# Patient Record
Sex: Male | Born: 1970 | Race: Asian | Hispanic: No | Marital: Married | Smoking: Never smoker
Health system: Southern US, Community
[De-identification: ages and names within clinical notes are randomized; demographics above are authoritative.]

## PROBLEM LIST (undated history)

## (undated) DIAGNOSIS — E079 Disorder of thyroid, unspecified: Secondary | ICD-10-CM

## (undated) DIAGNOSIS — I493 Ventricular premature depolarization: Secondary | ICD-10-CM

## (undated) DIAGNOSIS — M1990 Unspecified osteoarthritis, unspecified site: Secondary | ICD-10-CM

## (undated) DIAGNOSIS — M545 Low back pain, unspecified: Secondary | ICD-10-CM

## (undated) DIAGNOSIS — K219 Gastro-esophageal reflux disease without esophagitis: Secondary | ICD-10-CM

## (undated) DIAGNOSIS — N400 Enlarged prostate without lower urinary tract symptoms: Secondary | ICD-10-CM

## (undated) DIAGNOSIS — Z8619 Personal history of other infectious and parasitic diseases: Secondary | ICD-10-CM

## (undated) HISTORY — DX: Low back pain: M54.5

## (undated) HISTORY — DX: Unspecified osteoarthritis, unspecified site: M19.90

## (undated) HISTORY — DX: Disorder of thyroid, unspecified: E07.9

## (undated) HISTORY — DX: Low back pain, unspecified: M54.50

## (undated) HISTORY — DX: Ventricular premature depolarization: I49.3

## (undated) HISTORY — DX: Benign prostatic hyperplasia without lower urinary tract symptoms: N40.0

## (undated) HISTORY — DX: Personal history of other infectious and parasitic diseases: Z86.19

## (undated) HISTORY — DX: Gastro-esophageal reflux disease without esophagitis: K21.9

## (undated) HISTORY — PX: ROTATOR CUFF REPAIR: SHX139

---

## 2002-03-02 ENCOUNTER — Ambulatory Visit (HOSPITAL_COMMUNITY): Admission: RE | Admit: 2002-03-02 | Discharge: 2002-03-02 | Payer: Self-pay | Admitting: Endocrinology

## 2002-03-02 ENCOUNTER — Encounter: Payer: Self-pay | Admitting: Endocrinology

## 2002-03-30 ENCOUNTER — Ambulatory Visit (HOSPITAL_COMMUNITY): Admission: RE | Admit: 2002-03-30 | Discharge: 2002-03-30 | Payer: Self-pay | Admitting: Endocrinology

## 2002-03-30 ENCOUNTER — Encounter: Payer: Self-pay | Admitting: Endocrinology

## 2005-07-22 ENCOUNTER — Encounter: Admission: RE | Admit: 2005-07-22 | Discharge: 2005-07-22 | Payer: Self-pay | Admitting: Specialist

## 2006-10-01 HISTORY — PX: TRANSTHORACIC ECHOCARDIOGRAM: SHX275

## 2006-11-25 DIAGNOSIS — E039 Hypothyroidism, unspecified: Secondary | ICD-10-CM

## 2006-11-25 HISTORY — DX: Hypothyroidism, unspecified: E03.9

## 2007-09-08 ENCOUNTER — Encounter: Admission: RE | Admit: 2007-09-08 | Discharge: 2007-12-07 | Payer: Self-pay | Admitting: Orthopedic Surgery

## 2008-01-07 ENCOUNTER — Encounter
Admission: RE | Admit: 2008-01-07 | Discharge: 2008-04-06 | Payer: Self-pay | Admitting: Physical Medicine & Rehabilitation

## 2008-01-11 ENCOUNTER — Ambulatory Visit: Payer: Self-pay | Admitting: Physical Medicine & Rehabilitation

## 2008-02-01 ENCOUNTER — Ambulatory Visit: Payer: Self-pay | Admitting: Physical Medicine & Rehabilitation

## 2008-02-16 ENCOUNTER — Ambulatory Visit: Payer: Self-pay | Admitting: Physical Medicine & Rehabilitation

## 2008-03-29 ENCOUNTER — Ambulatory Visit: Payer: Self-pay | Admitting: Physical Medicine & Rehabilitation

## 2008-04-11 ENCOUNTER — Encounter
Admission: RE | Admit: 2008-04-11 | Discharge: 2008-06-20 | Payer: Self-pay | Admitting: Physical Medicine & Rehabilitation

## 2008-04-12 ENCOUNTER — Ambulatory Visit: Payer: Self-pay | Admitting: Physical Medicine & Rehabilitation

## 2008-04-18 ENCOUNTER — Encounter: Admission: RE | Admit: 2008-04-18 | Discharge: 2008-07-17 | Payer: Self-pay | Admitting: Anesthesiology

## 2008-04-19 ENCOUNTER — Ambulatory Visit: Payer: Self-pay | Admitting: Anesthesiology

## 2008-05-03 ENCOUNTER — Ambulatory Visit: Payer: Self-pay | Admitting: Anesthesiology

## 2008-05-17 ENCOUNTER — Ambulatory Visit: Payer: Self-pay | Admitting: Anesthesiology

## 2008-06-20 ENCOUNTER — Ambulatory Visit: Payer: Self-pay | Admitting: Physical Medicine & Rehabilitation

## 2008-06-28 ENCOUNTER — Ambulatory Visit: Payer: Self-pay | Admitting: Anesthesiology

## 2008-07-29 ENCOUNTER — Encounter
Admission: RE | Admit: 2008-07-29 | Discharge: 2008-10-27 | Payer: Self-pay | Admitting: Physical Medicine & Rehabilitation

## 2008-08-01 ENCOUNTER — Ambulatory Visit: Payer: Self-pay | Admitting: Physical Medicine & Rehabilitation

## 2008-08-08 ENCOUNTER — Encounter: Admission: RE | Admit: 2008-08-08 | Discharge: 2008-11-06 | Payer: Self-pay | Admitting: Anesthesiology

## 2008-08-09 ENCOUNTER — Ambulatory Visit: Payer: Self-pay | Admitting: Anesthesiology

## 2008-09-06 ENCOUNTER — Ambulatory Visit: Payer: Self-pay | Admitting: Anesthesiology

## 2008-10-04 ENCOUNTER — Ambulatory Visit: Payer: Self-pay | Admitting: Anesthesiology

## 2008-11-03 ENCOUNTER — Ambulatory Visit (HOSPITAL_BASED_OUTPATIENT_CLINIC_OR_DEPARTMENT_OTHER): Admission: RE | Admit: 2008-11-03 | Discharge: 2008-11-03 | Payer: Self-pay | Admitting: Family Medicine

## 2008-11-03 ENCOUNTER — Ambulatory Visit: Payer: Self-pay | Admitting: Radiology

## 2008-11-07 ENCOUNTER — Encounter
Admission: RE | Admit: 2008-11-07 | Discharge: 2009-01-30 | Payer: Self-pay | Admitting: Physical Medicine & Rehabilitation

## 2008-11-09 ENCOUNTER — Ambulatory Visit: Payer: Self-pay | Admitting: Physical Medicine & Rehabilitation

## 2009-01-30 ENCOUNTER — Ambulatory Visit: Payer: Self-pay | Admitting: Physical Medicine & Rehabilitation

## 2009-03-03 ENCOUNTER — Encounter: Admission: RE | Admit: 2009-03-03 | Discharge: 2009-03-07 | Payer: Self-pay | Admitting: Anesthesiology

## 2009-03-07 ENCOUNTER — Ambulatory Visit: Payer: Self-pay | Admitting: Anesthesiology

## 2009-05-10 ENCOUNTER — Encounter
Admission: RE | Admit: 2009-05-10 | Discharge: 2009-07-05 | Payer: Self-pay | Admitting: Physical Medicine & Rehabilitation

## 2009-05-12 ENCOUNTER — Ambulatory Visit: Payer: Self-pay | Admitting: Physical Medicine & Rehabilitation

## 2010-05-18 IMAGING — CR DG CHEST 2V
2 series · 2 of 2 positions shown · non-contrast
Comparison: 07/22/2005

CLINICAL DATA: Chronic cough for 3 weeks.  No chest pain.
Nonsmoker.

CHEST - 2 VIEW

[w chest pa]
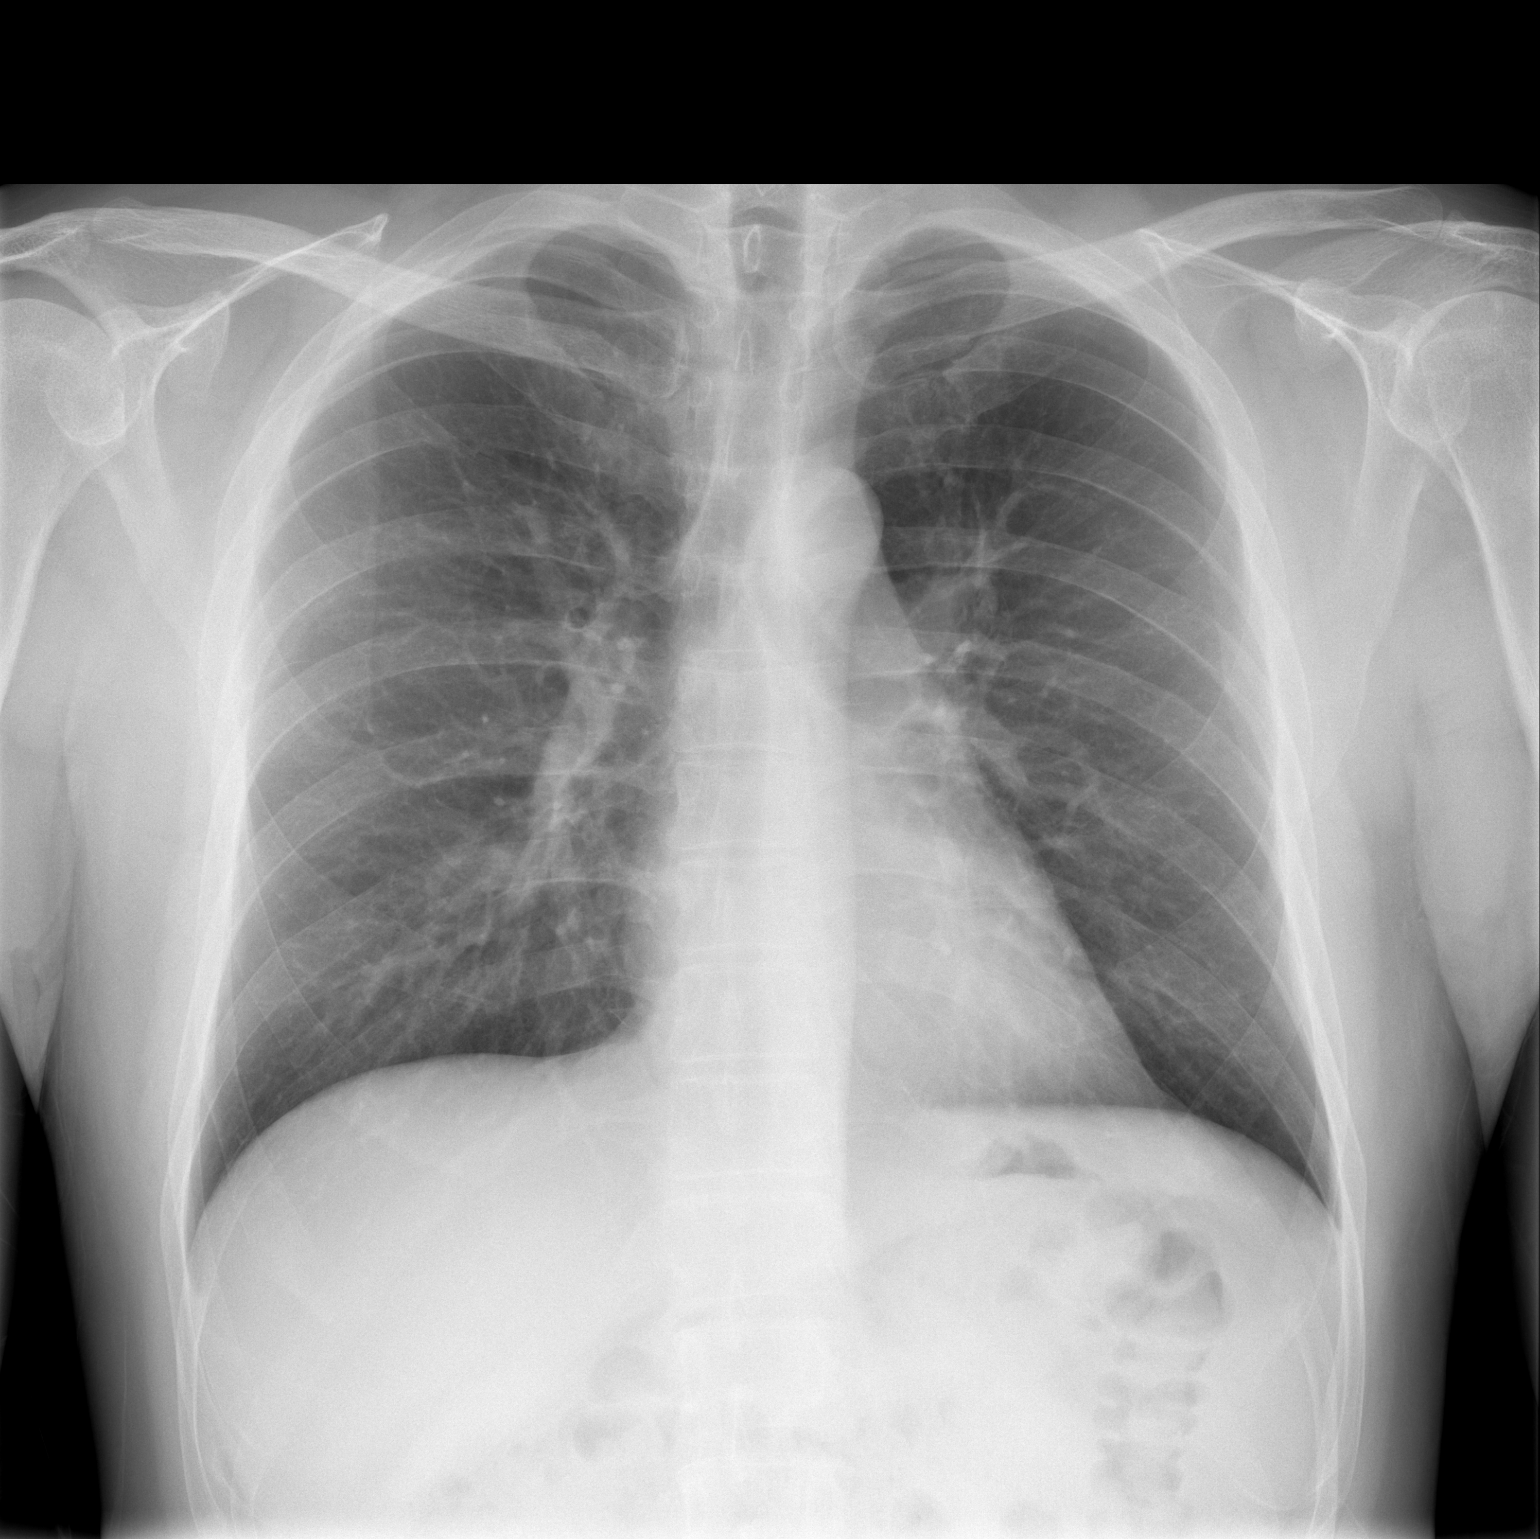

[w chest lat]
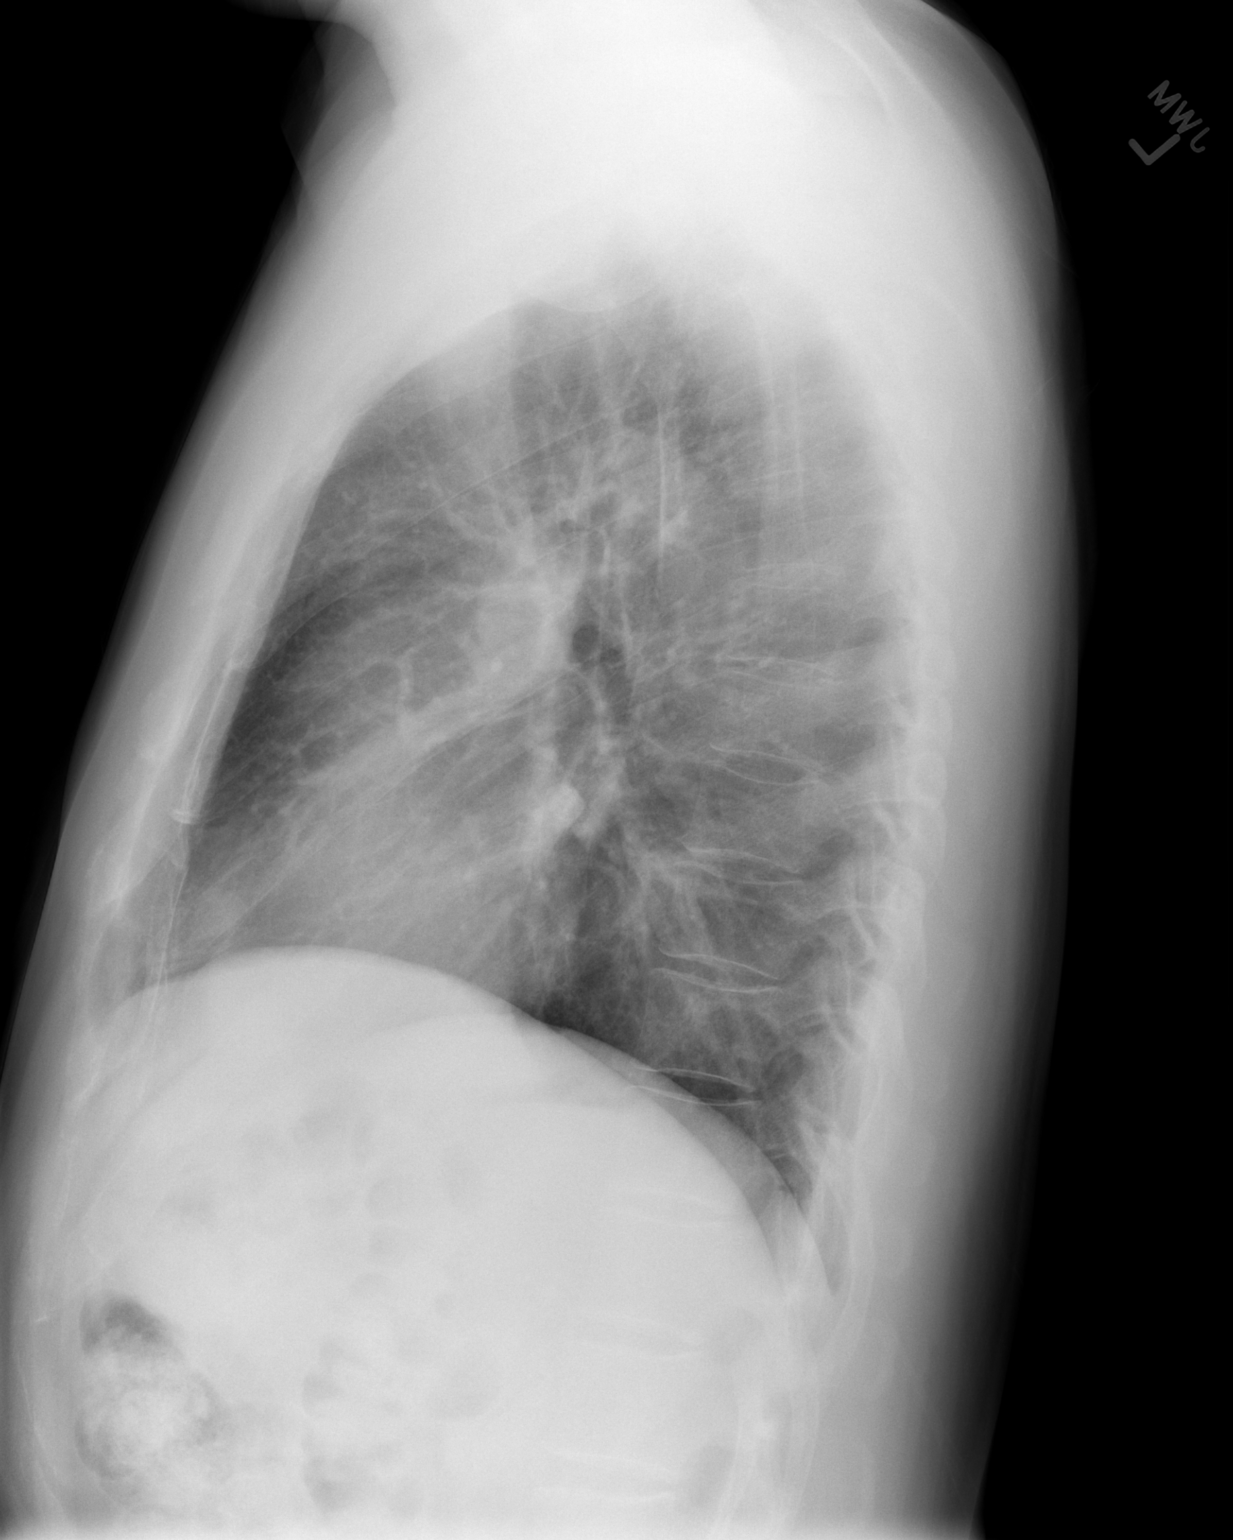

[2 of 2 positions shown; findings below may reference images not displayed]

FINDINGS: The cardiac and mediastinal silhouette appear
unremarkable.  The lungs are clear.  There is no bony abnormality.
There is no change from priors.
IMPRESSION: No active cardiopulmonary disease.

## 2010-11-27 NOTE — Procedures (Signed)
Steven Howell, Steven Howell               ACCOUNT NO.:  [id number]   MEDICAL RECORD NO.:  [id number]           PATIENT TYPE:   LOCATION:                                 FACILITY:   PHYSICIAN:  Ranelle Oyster, M.D.DATE OF BIRTH:  11-23-1970   DATE OF PROCEDURE:  DATE OF DISCHARGE:                               OPERATIVE REPORT   PROCEDURE:  Trigger point injections.   DIAGNOSTIC CODE:  723.9 myofascial trapezius/shoulder pain.   DESCRIPTION OF PROCEDURE:  After informed consent and preparation of  skin with isopropyl alcohol, we injected the right trapezius at 2  different locations, one anterior and one more posteriorly along the  medial third of the muscle were trigger points were palpated.  We also  injected the left trapezius in a medial position.  We injected the right  levator scapulae as well with one final injection.  Each injection  consisted of 2 mL of 1% lidocaine.  The patient tolerated well.  Aspiration technique was utilized prior to each injection.   I will see the patient back in 2-3 weeks' time.  Encouraged stretching  exercise, appropriate posture, etc.  Overall, is improved.      Ranelle Oyster, M.D.  Electronically Signed     ZTS/MEDQ  D:  02/01/2008 11:39:41  T:  02/02/2008 00:56:39  Job:  [id number]   cc:   Almedia Balls  Fax: [phone]

## 2010-11-27 NOTE — Procedures (Signed)
NAMEGOERGE, MOHR NO.:  [id number]   MEDICAL RECORD NO.:  [id number]         PATIENT TYPE:  HREC   LOCATION:                                 FACILITY:   PHYSICIAN:  Celene Kras, MD        DATE OF BIRTH:  1971/02/25   DATE OF PROCEDURE:  DATE OF DISCHARGE:                               OPERATIVE REPORT   1. [facility].  I      evaluated her, obtained history, and performed 14-point review of      systems.  He states he is about 50% improved from our initial      encounter, which he stated it was killing him, and the cervical      position is much less problematic.  He is still having problems in      the upper thoracic cervical region, descending a bit further down      into the region of about T3, with T2-T1 and C7 overlay.  It is      reasonable to inject these right and left side as we are making      progress, and consider positive predictive experience moving him      toward radiofrequency neural ablation.  I discussed this with him.  2. Other modifiable features and health profile discussed.  Another      rationale to perform intervention and minimize escalation of      controlled substances, he is appropriate here.   Objectively, diffuse parathoracic and paracervical myofascial comfort.  The spondylitic cervical position is improved.  Thoracic position is  still problematic particularly with extension and side bending.  Nothing  new neurological.   IMPRESSION:  Spondylosis cervical spine, spondylosis thoracic spine.   PLAN:  Cervical facet and thoracic facet medial branch intervention,  right and left side independent needle access points under local  anesthetic, 7, T1, 2, 3, with contributory innervation addressed at 1,  2.  Independent needle access points under local anesthetic.  Predicated  further intervention based on need and overall response.  He has  consented.  Follow up with Dr. Riley Kill, to  determine further course of  care.  We will give him a month's rest cycling.   The patient taken to the operative suite and placed in the prone  position.  Back prepped and draped in usual fashion.  Using a 25-gauge  needle, I advanced the cervical and thoracic facet as noted 7, 1, 2, and  3, right and left side independent needle access points.  I then  injected Marcaine 0.25%, MPF 0.5 mL at each level, with a total of 40 mg  Aristocort in divided dose.   Tolerated procedure well.  No complications from our procedure.  Assessment in the context of activities of daily living.  We will see  him for followup.           ______________________________  Celene Kras, MD     HH/MEDQ  D:  05/17/2008 16:08:11  T:  05/18/2008 05:18:19  Job:  [id number]

## 2010-11-27 NOTE — Procedures (Signed)
NAMEJOSEMARIA, BRINING NO.:  [id number]   MEDICAL RECORD NO.:  [id number]          PATIENT TYPE:  REC   LOCATION:  [facility]                          FACILITY:  [facility]   PHYSICIAN:  Celene Kras, MD        DATE OF BIRTH:  May 13, 1971   DATE OF PROCEDURE:  03/07/2009  DATE OF DISCHARGE:                               OPERATIVE REPORT   Steven Howell comes to the pain management today.  I evaluated him via  health and history form 14-point review of systems.  1. Spondylitic pain is escalated, he has had done really quite well,      functional improvement of both the cervical and lumbar procedures,      lumbar still benefiting.  It is reasonable to readdress the      cervical component.  He continues to work, he is very active.  He      does not like to take lot of medications, and he has demonstrated      clear predictive experience, with benchmarks greater than 75%,      better range of motion and quality of life indices.  I do not plan      to go on the block after this.  We consider even going onto RF at      some point.  I do not think that is necessary at this time as the      block seems to be helping him, but will revisit that if necessary.  2. Para lumbar position is still benefiting from intervention.  3. Follow up with Dr. Riley Kill.  Determine further course of care.   Objectively, diffuse paracervical myofascial, positive cervical fetal  compression test right and left with suprascapular, levator scapular  pain.  Pain with extension.  Nothing new neurologically.  Pain in the  paralumbar region mostly myofascial mechanical.   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   PLAN:  Facet medial branch intervention, above-mentioned levels, or at  mentioned levels 3, 4, 5, 6, and 7 right and left side, independent  needle access points under local anesthetic.  We to these levels as we  have a discussion as to what is  benefited him in the past and he thinks  that the higher segments were a significant benefit, so we will include  those, he understands potential for dizziness and dysphoria, and  reviewed with him.  He is consented for today's procedure.   The patient was taken to fluoroscopy suite and placed in supine  position.  Neck prepped and draped in the usual fashion using a 25-gauge  needle advanced facet at the medial branch of 3, 4, 5, 6, and 7 with  contributory innervation addressed right and left side, independent  needle access points under local anesthetic.  Confirmed placement.  Then, I inject 1-1/2 mL of lidocaine 1% MPF at each level with total of  40 mg of Aristocort divided dose.   Tolerated procedure well.  No complications from our procedures, assess  within context of activities of daily living.  We will see him in follow  up.  I will plan another block after this.  We will see how he does.          ______________________________  Celene Kras, MD    HH/MEDQ  D:  03/07/2009 12:03:30  T:  03/08/2009 05:44:13  Job:  [id number]

## 2010-11-27 NOTE — Assessment & Plan Note (Signed)
Steven Howell comes to [facility] today .  I evaluated  him and reviewed the Health and History form and 14-point review of  systems.   Steven Howell comes to [place] today stating recrudescence of the cervical  region, difficulty with endurance and range of motion activities.  He is  asking for a block today, I offered him trigger point injections, we  will give him a bit more time, and he is probably an individual who  would go to RF.  At this time, he wants to exhaust conservative  management and avoid RF and is requesting another block, so we will  probably delay that another month, and if he does need another block it  is probably reasonable as he gets the number of months of relief  cycling, and we minimize the escalation of controlled substances.  Furthermore, it allows him to help with his work, and play with his  young child, and he has many questions about overall expectations.  I  answered in lay terms.  He has had 2 thoracic, 2 cervical, the thoracic  is still responding, cervical is still problematic probably at 5, 6, 7  right and left, seemingly right greater than left at suprascapular and  levator scapular extension.  1. I do not believe further imaging or diagnostics are warranted at      this time considering our surgical colleagues at some point, but he      does not have any arm pain, mostly just spondylitic, and he      continues to respond to minimally invasive approaches.  He has      tried all the topicals and adjuncts.   Objectively, diffuse paracervical myofascial discomfort with positive  cervical facetal compression test, right and left, suboccipital  compression test positive.  Suprascapular and levator scapular pain.  I  do not see anything new neurologically.   IMPRESSION:  Spondylosis; degenerative spine disease, cervical spine;  degenerative spine disease of the thoracic spine.   PLAN:  Consider reinforcing the facet intervention at some  point and see  how he does.  Discharge instructions given.           ______________________________  Celene Kras, MD     HH/MedQ  D:  09/06/2008 11:25:06  T:  09/07/2008 02:37:10  Job #:  [id number]

## 2010-11-27 NOTE — Assessment & Plan Note (Signed)
Steven Howell is here and followup of his back pain.  He states that his pain  is much improved after the thoracic medial branch blocks, which were  essentially done at T3, T4, and T5 by Dr. Stevphen Rochester, bilaterally.  Complains of continued pain, however, to a lesser extent in the axillary  region as well as the subscapular region on the right and some other  vague areas over the shoulder blades, etc.  He states overall that his  pain is much improved, but then when he starts to become active, he has  problems with pain.  He rates his pain overall 4/10, described as a  burning and aching.  Pain interferes with general activity, relations  with others, enjoyment of life on a moderate level.  He occasionally  uses Lyrica and Celebrex, but does not uses every day.  He stopped  taking any of his antidepressants.  With most recently had him on  Lexapro.   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   SOCIAL HISTORY:  See above.  The patient lives with his wife and 1-year-  old son.   REVIEW OF SYSTEMS:  Notable for the above with no other specific changes  noted.   PHYSICAL EXAMINATION:  VITAL SIGNS:  Blood pressure 127/78, pulse is 81,  respiratory rate 18, and he is sating 97% on room air.  GENERAL:  The patient is pleasant, alert, and oriented x3.  On detailed examination of his back today.  There are no obvious  postural asymmetries.  There is some mild pain medial to the right  inferior border of the scapula, but this is not consistent.  It is a bit  worse with rightward bending, but minimally so.  He had full neck range  of motion of all shoulder and scapular movement today.  None of this  seemed to really provoke pain.  No pain with palpation of the axillary  region.  Flexion/extension of the spine causes no discomfort today nor  did rotation, lateral bending, etc.  Strength is 5/5 with normal sensory  exam.   ASSESSMENT:  Persistent thoracic and shoulder girdle pain,  which has  been multifactorial in nature.  The patient with spondylosis of the  cervical and thoracic spine, which has responded to trigger point  injections as well as recent medial branch blocks.  I think there is  also central aspect to his pain interwoven with some anxiety and  depression.  The patient was pushing for a thoracic epidural today.  It  is hard for me to see any frank indication for this based on his  radiographical imaging and exam.   PLAN:  1. I told the patient if he wishes to speak with Dr. Stevphen Rochester regarding      epidural that he could do so.  I can not specifically recommend an      epidural today.  2. We did start Cymbalta 30 mg p.o. q.a.m. to see if this helps up      from a central aspect with his pain.  I feel that the remainder of      his pain needs to be dealt with a more global aspect from mood,      coping, centralization standpoint.  His pain is substantially      improved from his initial visit to this office.  However, he seems      to focus on small areas of pain which I am not sure  ever truly will      disappear in his mind.  3. Along these lines I recommended followup physical therapy, to      reassess his posture as well as other modalities to improve the      efficiency and quality of his movements.  However, I am not sure      that he is interested in doing      anything of that nature at this point.  At times I am not sure      exactly what he is looking for from a treatment standpoint.      Steven Howell, M.D.  Electronically Signed     ZTS/MedQ  D:  08/01/2008 10:41:07  T:  08/02/2008 00:59:33  Job #:  [id number]   cc:   Celene Kras, MD  Fax: [phone]

## 2010-11-27 NOTE — Assessment & Plan Note (Signed)
Steven Howell is back regarding his back pain.  He has 40-60% decrease in his  scapular pain after the thoracic medial branch blocks, particularly at  C7, T1, T2, and T3.  He is still having some right axillary pain more  than left as well as some pain higher in the shoulder on the right.  He  rates his pain a 4/10.  Pain is dull and aching.  It seems to be worse  when he is active and better when he rests.  He sleeps well.  His mood  has been fairly good.   REVIEW OF SYSTEMS:  Notable for the above.  Full review is in the  written health and history section of the chart.   SOCIAL HISTORY:  The patient is married, living with his wife and son.  He is still working with [employer].   PHYSICAL EXAMINATION:  VITAL SIGNS:  Blood pressure 136/85, pulse is 82,  and respiratory rate 18.  He is sating 98% on room air.  GENERAL:  The patient is pleasant, alert, and oriented x3.  Affect is  bright and appropriate.  MUSCULOSKELETAL:  He still has some pain with particular maneuvering of  the thoracic spine, but really the pain was difficult to elicit.  He did  have no pain along the right sternocleidomastoid or trapezius muscles.  He has full rotator cuff movement.  He may have had some hypermobility  of the scapula in fact.  He had no tenderness in either bicipital  tendons nor the acromiclavicular joint.  Rotator cuff maneuvers were  negative.  NEUROLOGIC:  Motor exam was 5/5.  Normal sensory exam and reflexes  today.  PSYCH:  Mood was appropriate.  HEART:  Regular.  CHEST:  Clear.   ASSESSMENT:  Persistent thoracic spine pain.  Some of his pain appears  to have been facet mediated with good results with C7 through T3  injections.  We had a long discussion today regarding his axillary  shoulder pain.   PLAN:  1. We will send the patient back to Dr. Stevphen Rochester for T3, T4, and T5      medial branch blocks bilaterally to see if we can address any of      his lower shoulder blade and axillary  symptoms.  He can discuss      with Dr. Lisabeth Register regarding higher blocks in the further and potential      for radiofrequency ablation.  2. Continue exercise for range of motion as possible.  3. I will see him back, pending injections above.      Ranelle Oyster, M.D.  Electronically Signed     ZTS/MedQ  D:  06/20/2008 11:43:33  T:  06/21/2008 04:38:41  Job #:  [id number]   cc:   Celene Kras, MD  Fax: [phone]

## 2010-11-27 NOTE — Assessment & Plan Note (Signed)
Steven Howell is back regarding his leg and back pain.  He only had a few  days of relief with the last set of trigger point injections we  performed into the trapezius muscles.  He is having pain now in the  axillary line.  He is only having temporary relief from therapy.  He  liked the Flector patches somewhat and only experienced relief while he  had them on.  He is a bit frustrated with the whole process at this  point.  He rates his pain a 5/10 and describes it as dull, constant, and  aching.  Pain interferes with his general activity, relations with  others, and enjoyment of life on a moderate level.  Sleep is fair.   REVIEW OF SYSTEMS:  Notable for some depression, anxiety, and spasms.  Other pertinent positives are as above and full review is in the written  health and history section of the chart.   SOCIAL HISTORY:  The patient is married.  He is still working full time  as an Art gallery manager.   PHYSICAL EXAMINATION:  VITAL SIGNS:  Blood pressure 122/83, pulse 76,  respiratory rate 18, and sating 97% on room air.  GENERAL:  The patient is pleasant, alert and oriented x3.  Affect is  bright and appropriate.  Coordination is fair.  He had normal shoulder  movement and neck movement today.  He had some generalized vague  tenderness in the trapezius muscles bilaterally and into the rhomboids  and latissimus dorsi muscles.  Teres anterior muscles were not overtly  tender.  He did have noticeable elevation of the right hemipelvis over  the left at a quater inch.  He had really full unrestricted range of  motion that I could see.  I palpated no focal trigger points.  NEUROLOGICAL:  He had normal reflexes, sensations, and muscle strength.  Posture was generally appropriate except for elevation on the right.  HEART:  Regular.  CHEST:  Clear.  ABDOMEN:  Soft and nontender.   ASSESSMENT:  Ongoing myofascial shoulder girdle and thoracic pain.  He  has had some temporary relief to measures  including trigger point  injection therapy, etc.  He had been resistant to taking medications for  his pain.  Unfortunately, with the chronicity of his problem, he may  essentialize a bit with his pain symptoms.  I see no demonstrable  evidence of thoracic spine injury; therefore, I do not know that  injection is really a necessary option.   PLAN:  1. We will try to shift gears here a little bit.  He will resume      Lyrica 75 mg q.h.s. increasing to b.i.d.  2. Resume Celebrex 200 mg daily.  I want him to take both of these      medications regularly.  He can use heat, ice, and patches as well.  3. It is important that he continues with exercise and stretching.  I      think he can transition out of physical therapy and work on more of      a home exercise/maintenance program.  4. Consider antidepressant as well.  5. I will see him back in about 6 weeks' time.      Ranelle Oyster, M.D.  Electronically Signed     ZTS/MedQ  D:  02/16/2008 11:16:37  T:  02/17/2008 08:25:47  Job #:  [id number]

## 2010-11-27 NOTE — Assessment & Plan Note (Signed)
Steven Howell is back regarding his back pain.  He was unable to tolerate the  Cymbalta due to numbness in his arm.  He is back on his Lyrica 75 mg at  nighttime, which he finds benefits him somewhat.  He is having no nausea  with it at this time so far.  He wants to talk about potential facet  injections today in the T-spine areas.  Pain is still a 4/10.  Described  as constant.  Pain interferes with his general activity, in relationship  with others, and enjoyment of life on a moderate level.  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  Pain increases with walking,  standing, and general activity and improves with rest.  He is doing some  treatment of his own at home including massage, suction cup therapy,  etc.   REVIEW OF SYSTEMS:  Notable for depression and anxiety.  Other pertinent  positives are above.  Full review is in the written health and history  section in the chart.   SOCIAL HISTORY:  The patient is married, living with his wife and son,  and working as above.   PHYSICAL EXAMINATION:  VITAL SIGNS:  Blood pressure is 136/87, pulse is  93, respiratory rate 18, and he is sating 97% on room air.  GENERAL:  The patient is pleasant, alert and oriented x3.  He remains  anxious.  Coordination is good.  He has good posture today.  He has  minimal tenderness over the trapezius areas or rhomboids.  He had some  bruises over the area, where he used some suction cup treatments.  He is  able to bend either side without significant pain and rotate at the  trunk.  Neck flexion is intact as well as all other neck movements  today.  Strength is 5/5 with normal sensation and reflexes.  HEART:  Regular.  CHEST:  Clear.  ABDOMEN:  Soft and nontender.   ASSESSMENT:  1. 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      He could be displaying some facet arthropathy as well.  2. Anxiety and depression.   PLAN:  1. I still feel the need to treat his mood as the  biggest component of      his problem.  We will try Lexapro 10 mg nightly.  2. We will send the patient to Dr. Stevphen Rochester for bilateral medial branch      blocks at C7-T1, T1-T2, and T2-T3.  He has had no radiographical      evidence of facet arthropathy; however, with pain persistent, I      think we should go ahead with these to rule in/rule out the facets      as a cause of his pain.  3. He can continue with Lyrica for now as tolerated.  4. I will see him back pending the above.      Ranelle Oyster, M.D.  Electronically Signed     ZTS/MedQ  D:  04/12/2008 10:07:06  T:  04/13/2008 00:58:15  Job #:  [id number]

## 2010-11-27 NOTE — Procedures (Signed)
NAMEMARKES, SHATSWELL NO.:  [id number]   MEDICAL RECORD NO.:  [id number]          PATIENT TYPE:  REC   LOCATION:  [facility]                          FACILITY:  [facility]   PHYSICIAN:  Celene Kras, MD        DATE OF BIRTH:  03-11-1971   DATE OF PROCEDURE:  DATE OF DISCHARGE:                               OPERATIVE REPORT   Steven Howell comes to the pain management today.  I evaluated him via  the Health and History form 14-point review of systems, who responded to  cervical facet medial branch intervention.  It is reasonable to go on to  another block.  1. We plan 4, 5, 6, and 7 contributory innervation addressed,      independent needle access points right and left side, T1 will be      addressed.  2. Consider RF.  3. Remains very functional and active, these blocks have helped him,      but he understand that he is going to have a rest cycle after this      block, and we are mindful of steroid exposure related to him,      reviewed this procedure in detail, this is a therapeutic      intervention, as I do not think it is necessary go onto RF at this      time.  He is a responder, goes many months, and we can follow him      expectantly here.  Another rationale is to minimize escalation of      controlled substances.   Objectively his typical pain, suprascapular, levator scapular,  with  positive cervical facetal compression test.  Suboccipital compression  test positive.  Nothing new neurologically.   IMPRESSION:  Spondylosis, minimal myelopathy.   PLAN:  Cervical facet medial branch block 5, 6, and 7, 1.  Contributory  innervation addressed for independent needle access points under local  anesthetic.  He is consented and I did that with 0.25% Marcaine MPF 0.5  mL at each level with a total 40 of Aristocort in divided dose and  tolerated the procedure well.  No complications from our procedure.  Discharge instructions given.  Assessment within the context of  activities of daily living.           ______________________________  Celene Kras, MD     HH/MEDQ  D:  10/04/2008 12:01:32  T:  10/05/2008 01:14:01  Job:  [id number]

## 2010-11-27 NOTE — Procedures (Signed)
NAMEMarland Kitchen  Steven, Howell NO.:  [id number]   MEDICAL RECORD NO.:  [id number]          PATIENT TYPE:  REC   LOCATION:  [facility]                          FACILITY:  [facility]   PHYSICIAN:  Celene Kras, MD        DATE OF BIRTH:  Apr 30, 1971   DATE OF PROCEDURE:  DATE OF DISCHARGE:                               OPERATIVE REPORT   [facility].  I evaluated  him.  We obtained history and performed 14-point review of systems.  I  have reviewed Dr. Rosalyn Charters note's progress today and overall directed  care approach.  Apparently involved in a chiropractic manipulation  episode where the upper thoracic lower cervical segments were  manipulated resultant spondylitic pain.   I have reviewed imaging, which was essentially normal, but we will see  that sometimes with a facet disruption, and I am going to go ahead and  proceed with diagnostic therapeutic intervention.  I plan C7, T1, T2  right and left side independent needle access points under local  anesthetic.  He is also describing a cervical overlay and we may address  that as well.  Positive predictive experience lays  consideration of  radiofrequency neuroablation reviewed with him.  Questions were answered  and discussed in lay terms.  I have used models.  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   Objectively, mostly myofascial mechanical pain, pain with extension and  side bending, nothing new neurologically.   IMPRESSION:  Spondylosis, cervical, thoracic spine.  Degenerative spine  disease of the spine.   PLAN:  Facet medial branch intervention, C7, T1, T2 right and left side  independent needle access points under local anesthetic and he is  consented.  Predicated further intervention based on need and overall  response.  Consider addressing the cervical segments and followup in 2  weeks.  He will maintain contact with Dr. Riley Kill as well.   The  patient was taken to the fluoroscopy suite and placed in the prone  position.  The neck was prepped and draped in the usual fashion.  Using  a 25-gauge spinal needle under fluoroscopic observation, I advanced the  cervical facet at the medial branch C7, T1, T2 right and left side  independent needle access points.  Confirmed placement.  Then, injected  1 mL of lidocaine 1% MPF at each level with a total of 40 mg Aristocort  in divided dose.   He tolerated the procedure well.  No complications from our procedure.  Appropriate recovery.  Discharge instructions given.  Assessed within  context of activities of daily living.            ______________________________  Celene Kras, MD     HH/MEDQ  D:  04/19/2008 14:44:46  T:  04/20/2008 04:23:44  Job:  [id number]

## 2010-11-27 NOTE — Assessment & Plan Note (Signed)
PATIENT:  Steven Howell   DATE OF BIRTH:  04-20-1971.   SURGEON:  Jewel Baize. Stevphen Rochester, MD   [facility] and I  evaluated him.  I have reviewed the Health and History form.  I reviewed  the 14-point review of systems.   1. Dr. Tomasa Blase was asked to take a look at him.  He is benefited from      spondylitic intervention, to the thoracic facet lower cervical      elements, still having some persistent myofascial pain.  He is      improving, and therefore I recommend that he stay the course with      conservative management approach, and follow expectantly.  Consider      RF at some point.  I would only go on to RF if he has significant      either recrudescence or decline in function and quality of life.  2. I have used models and discussed in lay terms.  3. Home-based therapy.  Active and engaging, working with this young      family, and recommend conservative course of care at this time.   OBJECTIVE:  Mostly myofascial pain, diffuse parathoracic myofascial  discomfort and pain with extension and side bending.   IMPRESSION:  Spondylosis, degenerative spine disease of the thoracic  spine.   PROCEDURE:   PLAN:  Conservative management.  Discharge instructions given.           ______________________________  Celene Kras, MD     HH/MedQ  D:  08/09/2008 13:45:46  T:  08/10/2008 04:29:36  Job #:  [id number]

## 2010-11-27 NOTE — Assessment & Plan Note (Signed)
Steven Howell is back regarding his neck and back pain.  He had bilateral  medial branch blocks C4 through T1 by Dr. Stevphen Rochester and had good results  with that.  His pain is still in the vicinity, it was in the 4/10.  He  is interested in pursuing RFs, but like to consider more medial branch  blocks perhaps first.  He saw Dr. Stevphen Rochester in March of this year.  Pain is  dull, aching, usually in the low neck, and between his shoulder blades.  Pain interferes with general activity, in relations with others,  enjoyment of life on a moderate level.  Pain worsens with some  activities, improves with rest.  He uses Celebrex 200 mg daily p.r.n.   REVIEW OF SYSTEMS:  Notable for the above.  Full 14-point review is in  the written health and history section of the chart.   SOCIAL HISTORY:  Unchanged.   PHYSICAL EXAMINATION:  VITAL SIGNS:  Blood pressure is 123/79, pulse is  79, respiratory rate 18, he is sating 99% on room air.  GENERAL:  The patient is pleasant, alert, and oriented x3.  Affect is  generally bright and appropriate.  EXTREMITIES:  He has some generalized tenderness in the lower cervical  and upper thoracic spine and paraspinals.  There is some pain with  rotation of the scapulae bilaterally.  Rhomboids are slightly tender.  Strength is 5/5 and normal sensory exam.  NEUROLOGIC:  Cognitively, he is appropriate.   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  The patient with associated myofascial pain.  He has  responded to medial branch blocks.   PLAN:  1. Refer back to Dr. Stevphen Rochester for potential medial branch block C4      through C7 again.  Consider RR amps.  2. Wrote prescription for Celebrex 100 mg daily p.r.n. as he says he      would like to minimize medications if possible.  Celebrex seems to      work for him for flare-ups.  3. We will see him back after Dr. Stevphen Rochester on an as-needed basis.      Ranelle Oyster, M.D.  Electronically  Signed     ZTS/MedQ  D:  01/30/2009 09:47:13  T:  01/30/2009 23:12:20  Job #:  [id number]

## 2010-11-27 NOTE — Procedures (Signed)
NAMEDRAYCE, Steven Howell NO.:  [id number]   MEDICAL RECORD NO.:  [id number]           PATIENT TYPE:   LOCATION:                                 FACILITY:   PHYSICIAN:  Celene Kras, MD        DATE OF BIRTH:  11/08/1970   DATE OF PROCEDURE:  DATE OF DISCHARGE:                               OPERATIVE REPORT   Chr-Ming Keilman [facility].  I evaluated him  and reviewed the Health and History form and 14-point review of systems.   1. Cervical pain responded, as did upper thoracic to the injections,      spondylitic pain evident a bit lower.  His MRI is reviewed      essentially normal.  He does describe that as spondylitic pain,      with interference of activities of daily living and quality of life      indices.  Dr. Riley Kill has seen him and I think it is probably      reasonable we follow with T3, T4, and T5 thoracic intervention,      with contributory innervation addressed at 3.  This should cover      his described spondylitic pattern, and then we will give him rest      from interventions.  Consider RF at some point.  2. Other modifiable features in health profile discussed, forthright      engaging individual, he is very active, there is a newborn at home      and he wants to be able interact as best as possible, he is having      trouble here.   Objectively diffuse paralumbar myofascial discomfort, Fortin test  positive, side bending, pain with extension particularly in the thoracic  region.  Particularly directed to the right side.  Nothing new  neurologically.   IMPRESSION:  Spondylosis, minimal myelopathy, degenerative spine  disease, thoracic spine.   PLAN:  Thoracic intervention, facet medial branch, independent needle  access points under local anesthetic at 3, 4, and 5 right and left side,  and he is consented.   The patient was taken to the fluoroscopy suite and placed in the prone  position.  The back prepped and draped  in usual fashion using a 22-gauge  needle advanced into the thoracic facet at the medial branch, T3, T4,  and T5 right and left side independent needle access points, confirmed  placement.  I then injected 1 mL of Marcaine 0.5% MPF at each level with  a total of 40 mg of Aristocort in divided dose.   He tolerated the procedure well.  No complications from our procedure.  Appropriate recovery.  Discharge instructions given.  __________  activities of daily living and follow up with Dr. Riley Kill.           ______________________________  Celene Kras, MD     HH/MEDQ  D:  06/28/2008 16:39:31  T:  06/29/2008 05:02:23  Job:  [id number]

## 2010-11-27 NOTE — Assessment & Plan Note (Signed)
Steven Howell is back regarding his back pain.  He had the medial branch  blocks at C4, C5, C6, C7, T1 bilaterally by Dr. Stevphen Rochester.  He has had nice  effects with these.  His pain is 3/10.  He has had some increase in his  pain over the last week or so, but in general the pain has substantially  improved.  Denies pain with activities which require lifting and  prolonged exertion.  He is using occasional Celebrex for pain at this  point.  He discussed radiofrequency ablation with Dr. Stevphen Rochester as well as  the possibility.   Review of systems is notable for the above.  Full 14-point review is in  the written health and history section of the chart.   Social history is unchanged.   PHYSICAL EXAMINATION:  Blood pressure is 132/80, pulse is 71,  respiratory rate 16, sating 97% on room air.  The patient is pleasant,  alert and oriented x3.  Affect is bright and appropriate.  He is  generally stable.  He has some mild pain and tenderness along the  paraspinal muscles of the lower cervical and upper thoracic spine.  He  has some mild muscular pain in the lat as well as the rotator cuff  musculature, rhomboids, etc.  He has better neck range of motion in  general today.  His strength is 5/5.  Sensory exam is intact.  Cognitively, he is appropriate.   ASSESSMENT:  There was cervical higher thoracic spondylosis/facet  arthropathy.  The patient with associated myofascial pain.  He has had  good result with the recent medial branch blocks.   PLAN:  1. We continue  conservatively for now.  If pain recurs, could      consider repeat medial branch blocks versus RF.  2. We will use Celebrex 200 mg p.o. daily in the interim with an extra      one later in the day if needed.  3. Encouraged range of motion, strengthening, and endurance exercises      for trunk and shoulder girdle.  4. I will see him back in 3 months' time.      Ranelle Oyster, M.D.  Electronically Signed     ZTS/MedQ  D:   11/09/2008 10:54:03  T:  11/09/2008 23:36:33  Job #:  [id number]

## 2010-11-27 NOTE — Assessment & Plan Note (Signed)
Steven Howell is back regarding his neck and back pain.  He tried the Lyrica  and Celebrex but had to stop due to nausea.  He denies having  intermittent nausea and diarrhea still.  Apparently, he has had symptoms  of nausea and stomach upset in the past.  He is on Protonix daily which  he increased recently to b.i.d.  His pain is a bit improved 4/10,  although it is still present.  He has been a lot of stress at home due  to psychosocial issues.  He feels that he is anxious and depressed as a  whole and has some concern there.  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  He states that he has had ergonomic modifications to his work  site.   REVIEW OF SYSTEMS:  Notable for the above.  Full review is in the  written health and history section in the chart.   SOCIAL HISTORY:  The patient is married, living with his wife and son.  Mother is leaving soon.  She had been helping with the children.   PHYSICAL EXAMINATION:  VITAL SIGNS:  Blood pressure is 128/83, pulse is  69, respiratory rate 18, and he is sating 98% on room air.  GENERAL:  The patient is pleasant, alert and oriented x3.  Affect is  generally bright and appropriate, although he has been anxious.  Coordination is excellent.  He continues to have some tightness over the  right trapezius muscle more than left.  Posture is fair posteriorly,  although he is tended to stand with a head forward position.  No focal  or trigger points were appreciated today.  NEUROLOGICAL:  He had good strength and reflexes and sensory function.  HEART:  Regular.  CHEST:  Clear.  ABDOMEN:  Soft and nontender.   ASSESSMENT:  1. Chronic cervical thoracic pain related to myofascial dysfunction.  2. Anxiety with depression.   PLAN:  1. He will begin trial of Cymbalta 30 mg every morning for 1 week then      at 60 mg thereafter.  I think this will help with both pain and      mood.  2. He may use Celebrex p.r.n. only at this point. I hesitate using  it      scheduled due to his symptoms noted above.  3. Regarding his gastrointestinal symptoms, if these are persistent      despite treatment of his pain and anxiety should consider      gastrointestinal consult.  Continue Protonix for now.  4. We will not attempt to resume Lyrica at this point.  5. I encouraged appropriate posture, stretching exercise, etc.  6. I will see him back in about 2 months.      Ranelle Oyster, M.D.  Electronically Signed     ZTS/MedQ  D:  03/29/2008 12:05:49  T:  03/30/2008 03:26:03  Job #:  [id number]

## 2010-11-27 NOTE — Procedures (Signed)
NAMEALEXI, Howell NO.:  [id number]   MEDICAL RECORD NO.:  [id number]         PATIENT TYPE:  [facility]P   LOCATION:                                 FACILITY:   PHYSICIAN:  Celene Kras, MD        DATE OF BIRTH:  [birth year]   DATE OF PROCEDURE:  DATE OF DISCHARGE:                               OPERATIVE REPORT   Chr-Ming Sansoucie [facility].  I evaluated  him.  Reviewed history and performed 14-point review of systems.  He  does think some of his pain has improved.  I think we have had some  spondylitic response to the upper thoracic segments, describing more of  a cervical overlay today.  I am going to go ahead and address C6, C7, T1  today, right and left side with contributory elements at C5.  Risks,  complications, and options are fully outlined, bilateral intervention,  and he is well versed on this.  He has looked it up and is familiar with  my thought processes and possibly moving on to RF with positive  predictive experience.  He understands the benchmarks.  A very  intelligent individual, invested in his health decision making, I have  used models and discussed in lay terms.   Not sure we will go on to another intervention.  I want to see how he  does.  Risks, complications, and options are fully outlined.  Questions  were answered discussed in lay terms.   Objectively, diffuse parathoracic myofascial discomfort, paracervical  myofascial discomfort with positive cervical facetal compression test,  right and left, difficult to him, nothing new neurologically.   IMPRESSION:  Spondylosis, cervicothoracic.   PLAN:  Cervical facet medial branch intervention.  C5, C6, C7, T1 right  and left sides, independent needle access points under local anesthetic.  Predicate further intervention based on need and overall response with  questions answered, discussed in lay terms, and he is consented.   The patient was taken to fluoroscopy  suite and placed in prone position.  The back was prepped and draped in the usual fashion.  Using a 25-gauge  spinal needle, I advanced the facet at the medial branch of C5, C6, C7,  T1, right and left sides, independent needle access points confirmed  placement in multiple fluoroscopic positions.  Then, inject 0.5 mL of  lidocaine 1% MPF at each level with a total of 40 mg of Aristocort in  divided dose.   He tolerated the procedure well.  Assessment within context of  activities of daily living.  We will see him in 3-4 weeks.           ______________________________  Celene Kras, MD     HH/MEDQ  D:  05/03/2008 13:41:06  T:  05/04/2008 03:20:50  Job:  [id number]

## 2011-03-29 HISTORY — PX: OTHER SURGICAL HISTORY: SHX169

## 2011-08-16 ENCOUNTER — Institutional Professional Consult (permissible substitution): Payer: Self-pay | Admitting: Internal Medicine

## 2013-04-14 HISTORY — PX: NM MYOVIEW LTD: HXRAD82

## 2013-04-23 ENCOUNTER — Encounter: Payer: Self-pay | Admitting: Cardiology

## 2013-04-23 ENCOUNTER — Ambulatory Visit (INDEPENDENT_AMBULATORY_CARE_PROVIDER_SITE_OTHER): Payer: BC Managed Care – PPO | Admitting: Cardiology

## 2013-04-23 VITALS — BP 118/90 | HR 72 | Ht 69.0 in | Wt 186.6 lb

## 2013-04-23 DIAGNOSIS — R0789 Other chest pain: Secondary | ICD-10-CM | POA: Insufficient documentation

## 2013-04-23 DIAGNOSIS — I493 Ventricular premature depolarization: Secondary | ICD-10-CM

## 2013-04-23 DIAGNOSIS — R0602 Shortness of breath: Secondary | ICD-10-CM | POA: Insufficient documentation

## 2013-04-23 DIAGNOSIS — I4949 Other premature depolarization: Secondary | ICD-10-CM

## 2013-04-23 HISTORY — DX: Ventricular premature depolarization: I49.3

## 2013-04-23 MED ORDER — METOPROLOL SUCCINATE ER 25 MG PO TB24: 25.0000 mg | ORAL_TABLET | Freq: Every day | ORAL | Status: DC

## 2013-04-23 NOTE — Progress Notes (Signed)
PATIENTZalyn Howell MRN: [id number]  DOB: 11/25/1970   DOV:04/25/2013 PCP: Almedia Balls, MD  Clinic Note: Chief Complaint  Patient presents with  . Annual Exam    C/o occasional chest pressure, shortness of breath at rest, palpitations daily. This has been going on for about 5 months. States the pressure and SOB subside when he takes the Atenolol, he is not taking it every day.    HPI: Steven Howell is a 42 y.o. male with a PMH below who presents today for reevaluation of chest discomfort and strong heart beats. He is previously seen back in 2008 result 2004 for similar symptoms. At that time was evaluated with a treadmill stress test and a monitor which demonstrated frequent PVCs and bigeminy and trigeminy pattern. We did a nonstress test that was negative for any ischemia. He had an echocardiogram in 2008 was relatively normal..  Interval History: He presents today with about a month's worth of worsening discomfort in his chest. Can happen either at rest or with exertion usually after eating is a common time he notes it. He says he feels like a strange pressure in his chest with makes her short of breath. He also notes feeling very strong forceful heartbeats. He doesn't have any sense of palpitations as far as her rapid heartbeats. He does have irregular heartbeats. No sensation of dizziness or lightheadedness. No syncope or near-syncope. No PND, orthopnea or edema. He does notice that his symptoms are now much worse with exertion, he does note on his his taking his atenolol, his symptoms are much improved. His only concern with taking atenolol, as there moderate dramatically reduces his sexual drive.  He sees quite upset and concerned that these symptoms have occurred. He doesn't really like taking atenolol and having about needing to take medication for long-term. However he is willing to do so this would take symptoms improved. He does feel he was constipation but this is nothing serious. He  knows markers for the past and is going a more definitive assessment now is getting older. He's very concerned about the potential of having an adverse cardiac event occur and leaving his family without a provider.  Past Medical History  Diagnosis Date  . Thyroid disease   . Arthritis   . Lower back pain   . PVC (premature ventricular contraction)     W/ Holter monitor demonstrated trigeminy and bigeminy PVCs  . GERD (gastroesophageal reflux disease)     Prior Cardiac Evaluation and Past Surgical History: Past Surgical History  Procedure Laterality Date  . Exercise stress test  03/29/2011    Negative adequate Bruce protocol exercise stress test  . Transthoracic echocardiogram  10/01/2006    EF 55-65%, relatively normal   No Known Allergies  Current Outpatient Prescriptions  Medication Sig Dispense Refill  . finasteride (PROSCAR) 5 MG tablet Take 1 tablet by mouth every other day.      . metoprolol succinate (TOPROL XL) 25 MG 24 hr tablet Take 1 tablet (25 mg total) by mouth daily.  30 tablet  12  . SYNTHROID 100 MCG tablet Take 1 tablet by mouth daily.       No current facility-administered medications for this visit.    History   Social History Narrative   He is a married father of 2. He works for [employer].   He does not smoke or drink alcohol.    ROS: A comprehensive Review of Systems - Negative except Pertinent positives noted above. Psychological ROS: positive for -  anxiety, decreased libido and Would decrease the beta being while on atenolol  PHYSICAL EXAM BP 118/90  Pulse 72  Ht 5\' 9"  (1.753 m)  Wt 186 lb 9.6 oz (84.641 kg)  BMI 27.54 kg/m2 General appearance: alert, cooperative, appears stated age, no distress and not obese Neck: no adenopathy, no carotid bruit and no JVD Lungs: clear to auscultation bilaterally, normal percussion bilaterally and non-labored Heart: regular rate and rhythm, S1, S2 normal, no murmur, click, rub or gallop ; non-displaced PMI Abdomen:  soft, non-tender; bowel sounds normal; no masses,  no organomegaly; No HJR Extremities: extremities normal, atraumatic, no cyanosis or edema Pulses: 2+ and symmetric; Neurologic: Mental status: Alert, oriented, thought content appropriate Cranial nerves: normal (II-XII grossly intact) HEENT: [place]/AT, EOMI, MMM, anicteric sclera  [id number] today: Yes Rate:72 , Rhythm: NSR, normal ECG  Recent Labs: None  ASSESSMENT / PLAN: Chest tightness or pressure - occuring at rest Relatively healthy gentleman with recurrence of her rash and exertional chest pressure. These are usually associated with a strange heartbeats, but is also associated in some with dyspnea. He does note occasionally having with exertion as well. It is somewhat atypical and unusual as far as her cardiac status ischemic etiology, however he does have quite frequent PVCs that are relatively symptomatic and therefore the results of possibility of cardiac ischemia. He has had 2 negative treadmill stress test in the past, now for recurrent symptoms. At this point I think a more definitive study is warranted, he has much for the patient's comfort level as it is for the fact that treadmill stress tests are not as accurate.  Plan: Treadmill/exercise Myoview to better assess any his PVCs and there reaction to exertion as well as to evaluate for ischemia. Based on these results, we'll determine how aggressive we would need to be within the respective modification. I'll see him back after the tests are over immediately if the stress test is abnormal, otherwise and would wait at least 2 months to ensure that beta blockers be effective in treating his symptoms.  Shortness of breath Feeling dyspneic but would be unusual simply with his PVCs, therefore it to get worse a little more when evaluation as described above.  PVC's (premature ventricular contractions) The really seem to be the daughter is helpful with his symptoms, but he doesn't want  it because of decreased to below. I think it needs the atenolol is nonspecific and a beta blocker and he may benefit from a more specific beta-1 selective beta blocker.   Plan: DC atenolol, start Toprol-XL 25 mg daily. Nadolol would be the next best choice for long term coverage, but lacks Beta1 selectivity.    Orders Placed This Encounter  Procedures  . Myocardial Perfusion Imaging    Standing Status: Future     Number of Occurrences:      Standing Expiration Date: 04/23/2014    Order Specific Question:  Where should this test be performed    Answer:  [facility]    Order Specific Question:  Type of stress    Answer:  Exercise    Order Specific Question:  Patient weight in lbs    Answer:  186  . EKG 12-Lead  . EKG 12-Lead    This order was created through External Result Entry   Meds ordered this encounter  Medications  . finasteride (PROSCAR) 5 MG tablet    Sig: Take 1 tablet by mouth every other day.  . metoprolol succinate (TOPROL XL) 25 MG 24  hr tablet    Sig: Take 1 tablet (25 mg total) by mouth daily.    Dispense:  30 tablet    Refill:  12   Followup: 2 months  DAVID W. Herbie Baltimore, M.D., M.S. [facility] [address] [place], [place]  [address]  [phone] Pager # [phone]

## 2013-04-23 NOTE — Patient Instructions (Signed)
So it would seem that your Abnormal Heart Beats are coming back.    This time it seems that they are more symptomatic.  --> Since the Atenolol has affected other quality of life concerns, I am going to switch medications to Toprol (Metoprolol Succinate) 25 mg daily (if this medicine is not covered by your insurance, I will change to the shorter acting 2x daily version).  Because the symptoms seem more severe & you are reluctant to exert yourself, we will check an Exercise Nuclear Stress Test.  Marykay Lex, MD  Your physician wants you to follow-up in: 2 You will receive a reminder letter in the mail two months in advance. If you don't receive a letter, please call our office to schedule the follow-up appointment.

## 2013-04-25 ENCOUNTER — Encounter: Payer: Self-pay | Admitting: Cardiology

## 2013-04-25 NOTE — Assessment & Plan Note (Signed)
The really seem to be the daughter is helpful with his symptoms, but he doesn't want it because of decreased to below. I think it needs the atenolol is nonspecific and a beta blocker and he may benefit from a more specific beta-1 selective beta blocker.   Plan: DC atenolol, start Toprol-XL 25 mg daily. Nadolol would be the next best choice for long term coverage, but lacks Beta1 selectivity.

## 2013-04-25 NOTE — Assessment & Plan Note (Signed)
Feeling dyspneic but would be unusual simply with his PVCs, therefore it to get worse a little more when evaluation as described above.

## 2013-04-25 NOTE — Assessment & Plan Note (Addendum)
Relatively healthy gentleman with recurrence of her rash and exertional chest pressure. These are usually associated with a strange heartbeats, but is also associated in some with dyspnea. He does note occasionally having with exertion as well. It is somewhat atypical and unusual as far as her cardiac status ischemic etiology, however he does have quite frequent PVCs that are relatively symptomatic and therefore the results of possibility of cardiac ischemia. He has had 2 negative treadmill stress test in the past, now for recurrent symptoms. At this point I think a more definitive study is warranted, he has much for the patient's comfort level as it is for the fact that treadmill stress tests are not as accurate.  Plan: Treadmill/exercise Myoview to better assess any his PVCs and there reaction to exertion as well as to evaluate for ischemia. Based on these results, we'll determine how aggressive we would need to be within the respective modification. I'll see him back after the tests are over immediately if the stress test is abnormal, otherwise and would wait at least 2 months to ensure that beta blockers be effective in treating his symptoms.

## 2013-04-27 ENCOUNTER — Ambulatory Visit: Payer: Self-pay | Admitting: Cardiology

## 2013-04-29 ENCOUNTER — Encounter (HOSPITAL_COMMUNITY): Payer: BC Managed Care – PPO

## 2013-05-03 ENCOUNTER — Encounter: Payer: Self-pay | Admitting: Cardiology

## 2013-05-07 ENCOUNTER — Ambulatory Visit (HOSPITAL_COMMUNITY)
Admission: RE | Admit: 2013-05-07 | Discharge: 2013-05-07 | Disposition: A | Discharge status: Home / Self Care | Payer: BC Managed Care – PPO | Source: Ambulatory Visit | Attending: Cardiovascular Disease | Admitting: Cardiovascular Disease

## 2013-05-07 DIAGNOSIS — R42 Dizziness and giddiness: Secondary | ICD-10-CM | POA: Insufficient documentation

## 2013-05-07 DIAGNOSIS — E663 Overweight: Secondary | ICD-10-CM | POA: Insufficient documentation

## 2013-05-07 DIAGNOSIS — R0602 Shortness of breath: Secondary | ICD-10-CM | POA: Insufficient documentation

## 2013-05-07 DIAGNOSIS — R002 Palpitations: Secondary | ICD-10-CM | POA: Insufficient documentation

## 2013-05-07 DIAGNOSIS — I493 Ventricular premature depolarization: Secondary | ICD-10-CM

## 2013-05-07 DIAGNOSIS — R079 Chest pain, unspecified: Secondary | ICD-10-CM | POA: Insufficient documentation

## 2013-05-07 DIAGNOSIS — R0789 Other chest pain: Secondary | ICD-10-CM

## 2013-05-07 DIAGNOSIS — R0609 Other forms of dyspnea: Secondary | ICD-10-CM | POA: Insufficient documentation

## 2013-05-07 DIAGNOSIS — I4949 Other premature depolarization: Secondary | ICD-10-CM

## 2013-05-07 DIAGNOSIS — R0989 Other specified symptoms and signs involving the circulatory and respiratory systems: Secondary | ICD-10-CM | POA: Insufficient documentation

## 2013-05-07 MED ORDER — TECHNETIUM TC 99M SESTAMIBI GENERIC - CARDIOLITE
31.6000 | Freq: Once | INTRAVENOUS | Status: AC | PRN
  Administered 2013-05-07: 32 via INTRAVENOUS

## 2013-05-07 MED ORDER — TECHNETIUM TC 99M SESTAMIBI GENERIC - CARDIOLITE
10.2000 | Freq: Once | INTRAVENOUS | Status: AC | PRN
  Administered 2013-05-07: 10 via INTRAVENOUS

## 2013-05-07 NOTE — Procedures (Addendum)
[name] [address] [place] [address] [phone]  Cardiology Nuclear Med Study  Steven Howell is a 42 y.o. male     MRN : [id number]     DOB: Sep 28, 1970  Procedure Date: 05/07/2013  Nuclear Med Background Indication for Stress Test:  Evaluation for Ischemia History:  No history reported. Cardiac Risk Factors: Lipids, Overweight and PVC's  Symptoms:  Chest Pain, DOE, Light-Headedness, Palpitations and SOB   Nuclear Pre-Procedure Caffeine/Decaff Intake:  9:00pm NPO After: 7:00am   IV Site: R Hand  IV 0.9% NS with Angio Cath:  22g  Chest Size (in):  44"  IV Started by: Emmit Pomfret, RN  Height: 5\' 9"  (1.753 m)  Cup Size: n/a  BMI:  Body mass index is 27.45 kg/(m^2). Weight:  186 lb (84.369 kg)   Tech Comments:  n/a    Nuclear Med Study 1 or 2 day study: 1 day  Stress Test Type:  Stress  Order Authorizing Provider:  Bryan Lemma, MD   Resting Radionuclide: Technetium 54m Sestamibi  Resting Radionuclide Dose: 10.2 mCi   Stress Radionuclide:  Technetium 92m Sestamibi  Stress Radionuclide Dose: 31.6 mCi           Stress Protocol Rest HR: 74 Stress HR: 173  Rest BP:129/80 Stress BP: 196/94  Exercise Time (min): 10:31 METS: 11.90   Predicted Max HR: 178 bpm % Max HR: 97.19 bpm Rate Pressure Product: 21308  Dose of Adenosine (mg):  n/a Dose of Lexiscan: n/a mg  Dose of Atropine (mg): n/a Dose of Dobutamine: n/a mcg/kg/min (at max HR)  Stress Test Technologist: Ernestene Mention, CCT Nuclear Technologist: Gonzella Lex, CNMT   Rest Procedure:  Myocardial perfusion imaging was performed at rest 45 minutes following the intravenous administration of Technetium 41m Sestamibi. Stress Procedure:  The patient performed treadmill exercise using a Bruce  Protocol for 10 minutes and 31 seconds. The patient stopped due to leg discomfort and fatigue. Patient denied any chest pain.  There were no significant ST-T wave  changes.  Technetium 65m Sestamibi was injected at peak exercise and myocardial perfusion imaging was performed after a brief delay.  Transient Ischemic Dilatation (Normal <1.22):  0.70 Lung/Heart Ratio (Normal <0.45):  0.27 QGS EDV:  76 ml QGS ESV:  22 ml LV Ejection Fraction: 71%  Signed by       Rest ECG: NSR - Normal EKG  Stress ECG: No significant change from baseline ECG  QPS Raw Data Images:  Normal; no motion artifact; normal heart/lung ratio. Stress Images:  Normal homogeneous uptake in all areas of the myocardium. Rest Images:  Normal homogeneous uptake in all areas of the myocardium. Subtraction (SDS):  No evidence of ischemia.  Impression Exercise Capacity:  Excellent exercise capacity. BP Response:  Normal blood pressure response. Clinical Symptoms:  No significant symptoms noted. ECG Impression:  No significant ST segment change suggestive of ischemia. Comparison with Prior Nuclear Study: No previous nuclear study performed  Overall Impression:  Normal stress nuclear study.  LV Wall Motion:  NL LV Function; NL Wall Motion   Runell Gess, MD  05/07/2013 5:41 PM

## 2013-05-13 ENCOUNTER — Telehealth: Payer: Self-pay | Admitting: *Deleted

## 2013-05-13 NOTE — Telephone Encounter (Signed)
Left message of myoview results ,call if any question.

## 2013-05-13 NOTE — Telephone Encounter (Signed)
Message copied by Tobin Chad on Thu May 13, 2013  3:06 PM ------      Message from: Herbie Baltimore, DAVID W      Created: Fri May 07, 2013  6:45 PM       GREAT NEWS - negative ST.  CP is not related to heart artery disease.            Marykay Lex, MD       ------

## 2013-05-13 NOTE — Telephone Encounter (Signed)
Call to pt and results given per MD.  Pt verbalized understanding.  

## 2013-05-13 NOTE — Telephone Encounter (Signed)
Returning call from [facility].

## 2014-09-14 DIAGNOSIS — M542 Cervicalgia: Secondary | ICD-10-CM

## 2014-09-14 HISTORY — DX: Cervicalgia: M54.2

## 2015-08-21 DIAGNOSIS — N529 Male erectile dysfunction, unspecified: Secondary | ICD-10-CM | POA: Insufficient documentation

## 2015-08-21 DIAGNOSIS — G8929 Other chronic pain: Secondary | ICD-10-CM | POA: Insufficient documentation

## 2015-08-21 DIAGNOSIS — N400 Enlarged prostate without lower urinary tract symptoms: Secondary | ICD-10-CM | POA: Insufficient documentation

## 2015-08-21 HISTORY — DX: Male erectile dysfunction, unspecified: N52.9

## 2015-08-21 HISTORY — DX: Other chronic pain: G89.29

## 2015-11-03 DIAGNOSIS — F411 Generalized anxiety disorder: Secondary | ICD-10-CM

## 2015-11-03 HISTORY — DX: Generalized anxiety disorder: F41.1

## 2016-04-24 DIAGNOSIS — K30 Functional dyspepsia: Secondary | ICD-10-CM | POA: Insufficient documentation

## 2016-04-24 HISTORY — DX: Functional dyspepsia: K30

## 2016-05-30 ENCOUNTER — Ambulatory Visit (INDEPENDENT_AMBULATORY_CARE_PROVIDER_SITE_OTHER): Payer: BLUE CROSS/BLUE SHIELD | Admitting: Cardiology

## 2016-05-30 ENCOUNTER — Encounter: Payer: Self-pay | Admitting: Cardiology

## 2016-05-30 VITALS — BP 117/70 | HR 74 | Ht 69.0 in | Wt 170.0 lb

## 2016-05-30 DIAGNOSIS — I493 Ventricular premature depolarization: Secondary | ICD-10-CM | POA: Diagnosis not present

## 2016-05-30 DIAGNOSIS — R0789 Other chest pain: Secondary | ICD-10-CM | POA: Diagnosis not present

## 2016-05-30 MED ORDER — METOPROLOL SUCCINATE ER 25 MG PO TB24: 25.0000 mg | ORAL_TABLET | Freq: Every day | ORAL | 3 refills | Status: DC

## 2016-05-30 NOTE — Progress Notes (Signed)
PCP: Steven BallsKELLY,SAM, MD  Clinic Note: Chief Complaint  Patient presents with  . New Patient (Initial Visit)  . Palpitations    has been occurring randomly for 2 weeks  . Shortness of Breath    when palpitations occur    HPI: Steven Howell is a 45 y.o. male with a PMH below who presents today for reestablishing cardiology care.. I last saw Steven Howell in October 2014 as part of evaluation for chest discomfort strong heartbeats. He previously been evaluated with a treadmill stress test as well as monitor demonstrating PVCs with bigeminy trigeminy. I switched him from atenolol to metoprolol XL with the potential of considering Nadolol all is well. Echocardiogram in 2008 was normal Reevaluated with a Myoview stress test that month - read as normal, low risk studies with no ischemia or infarction and normal EF.  Recent Hospitalizations: None  Studies Reviewed: none  Interval History: [name] Returns now basically because he is had recurrence of his irregular heart beat sensations. He says that they're now happening more frequently past 2 months or so. He's been having episodes lasting all day of feeling these irregular beats. One may have persistent he feels a little bit dizzy and short of breath, but for the most part it is wakes him up and feels unusual. Using worse when he's at rest, it never occurs with exertion. When he's been having these episodes he just feels well but more weak and tired than usual, and actually feels as though he's been exercising when the symptoms finally abate.  Otherwise really he's not had any other cardiac symptoms besides the regular heartbeats with their associated symptoms. He has not had any resting or exertional chest discomfort or pressure. No resting or exertional dyspnea without palpitations. No PND, orthopnea or edema. No syncope or near-syncope, but he has had some dizziness with the palpitations. No TIA/amaurosis fugax.  No claudication.  ROS: A  comprehensive was performed. Review of Systems  Constitutional: Negative for chills, fever and malaise/fatigue.  HENT: Negative.   Respiratory: Negative.   Cardiovascular: Positive for palpitations (Associated with dyspnea and dizziness as well as just a sense of discomfort and anxiety).  Gastrointestinal: Negative for blood in stool and melena.  Genitourinary: Negative for dysuria and hematuria.  Musculoskeletal: Negative.   Skin: Negative.   Neurological: Positive for dizziness (With palpitations). Negative for focal weakness, seizures and loss of consciousness.  Endo/Heme/Allergies: Negative for environmental allergies.  Psychiatric/Behavioral: Negative for depression and memory loss. The patient is not nervous/anxious and does not have insomnia.   All other systems reviewed and are negative.   Past Medical History:  Diagnosis Date  . Arthritis   . GERD (gastroesophageal reflux disease)   . Lower back pain   . PVC (premature ventricular contraction)    W/ Holter monitor demonstrated trigeminy and bigeminy PVCs  . Thyroid disease     Past Surgical History:  Procedure Laterality Date  . EXERCISE STRESS TEST  03/29/2011   Negative adequate Bruce protocol exercise stress test  . NM MYOVIEW LTD  04/2013   LOW RISK. No ischemia or infarction. Normal stress test.  . TRANSTHORACIC ECHOCARDIOGRAM  10/01/2006   EF 55-65%, relatively normal   Current Meds  Medication Sig  . finasteride (PROSCAR) 5 MG tablet Take 1 tablet by mouth every other day.  Marland Kitchen. SYNTHROID 100 MCG tablet Take 1 tablet by mouth daily.   No Known Allergies  Social History   Social History  . Marital status: Single  Spouse name: N/A  . Number of children: N/A  . Years of education: N/A   Social History Main Topics  . Smoking status: Never Smoker  . Smokeless tobacco: Never Used  . Alcohol use No  . Drug use: No  . Sexual activity: Not Asked   Other Topics Concern  . None   Social History Narrative     He is a married father of 2. He works for [employer].   He does not smoke or drink alcohol.   Family History  Problem Relation Age of Onset  . Hyperlipidemia Mother   . Hypertension Father   . Stroke Father   . Cancer Maternal Grandmother   . Cancer Maternal Grandfather   . Stroke Paternal Grandmother   . Stroke Paternal Grandfather     Wt Readings from Last 3 Encounters:  05/30/16 77.1 kg (170 lb)  05/07/13 84.4 kg (186 lb)  04/23/13 84.6 kg (186 lb 9.6 oz)    PHYSICAL EXAM BP 117/70   Pulse 74   Ht 5\' 9"  (1.753 m)   Wt 77.1 kg (170 lb)   BMI 25.10 kg/m  General appearance: alert, cooperative, appears stated age, no distress and not obese HEENT: [place]/AT, EOMI, MMM, anicteric sclera Neck: no adenopathy, no carotid bruit and no JVD Lungs: clear to auscultation bilaterally, normal percussion bilaterally and non-labored Heart: regular rate and rhythm -- with notable Ectopy. S1 & 2 normal, no murmur, click, rub or gallop ; non-displaced PMI Abdomen: soft, non-tender; bowel sounds normal; no masses,  no organomegaly; No HJR Extremities: extremities normal, atraumatic, no cyanosis or edema Pulses: 2+ and symmetric; Neurologic: Mental status: Alert, oriented, thought content appropriate Cranial nerves: normal (II-XII grossly intact)    Adult ECG Report  Rate: 74 ;  Rhythm: normal sinus rhythm and Normal axis, intervals and durations;   Narrative Interpretation: Normal EKG    Other studies Reviewed: Additional studies/ records that were reviewed today include:  Recent Labs:  None available.    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 I think we will simply restart Toprol 25 mg a day. He can use an additional dose when necessary. Nothing else to suggest a need for further evaluation. Next line reassess in a few months to see how his symptoms are  doing.  This may be related to anxiety or stress, he does indicate that he is certainly cutting back on his caffeine, stating that if he drinks a full cup of coffee deathlypalpitations. - Avoiding caffeine both coffee and sodas.      Relevant Medications   metoprolol succinate (TOPROL XL) 25 MG 24 hr tablet   Other Relevant Orders   EKG 12-Lead   Chest tightness or pressure - occuring at rest    Only associated with palpitations. Previously evaluated with no evidence of ischemia.          Current medicines are reviewed at length with the patient today. (+/- concerns) - Sx were previously well controlled with Toprol The following changes have been made:    Patient Instructions  RESTART METOPROLOL SUCCINATE ( TOPROL XL) 25 MG ONE TABLET DAILY.    NO OTHER CHANGES WIT CURRENT TREATMENT    Your physician recommends that you schedule a follow-up appointment in: Jan 2018 with DR Haylee Mcanany.    Studies Ordered:   Orders Placed This Encounter  Procedures  . EKG 12-Lead  Soniya Ashraf, M.D., M.S. Interventional Cardiologist   Pager # [phone] Phone # [phone] [address] [place], [place] [address] 

## 2016-05-30 NOTE — Patient Instructions (Signed)
RESTART METOPROLOL SUCCINATE ( TOPROL XL) 25 MG ONE TABLET DAILY.    NO OTHER CHANGES WIT CURRENT TREATMENT    Your physician recommends that you schedule a follow-up appointment in: Jan 2018 with DR HARDING.

## 2016-06-01 ENCOUNTER — Encounter: Payer: Self-pay | Admitting: Cardiology

## 2016-06-01 NOTE — Assessment & Plan Note (Signed)
Only associated with palpitations. Previously evaluated with no evidence of ischemia.

## 2016-06-01 NOTE — Assessment & Plan Note (Addendum)
Previously well-controlled PVCs with Toprol. I think we will simply restart Toprol 25 mg a day. He can use an additional dose when necessary. Nothing else to suggest a need for further evaluation. Next line reassess in a few months to see how his symptoms are doing.  This may be related to anxiety or stress, he does indicate that he is certainly cutting back on his caffeine, stating that if he drinks a full cup of coffee deathlypalpitations. - Avoiding caffeine both coffee and sodas.

## 2016-06-13 DIAGNOSIS — S83412A Sprain of medial collateral ligament of left knee, initial encounter: Secondary | ICD-10-CM | POA: Insufficient documentation

## 2016-06-13 DIAGNOSIS — G8929 Other chronic pain: Secondary | ICD-10-CM | POA: Insufficient documentation

## 2016-06-13 HISTORY — DX: Sprain of medial collateral ligament of left knee, initial encounter: S83.412A

## 2016-06-13 HISTORY — DX: Other chronic pain: G89.29

## 2016-06-17 ENCOUNTER — Ambulatory Visit: Payer: Self-pay | Admitting: Cardiology

## 2016-08-09 ENCOUNTER — Ambulatory Visit: Payer: BLUE CROSS/BLUE SHIELD | Admitting: Cardiology

## 2017-03-20 DIAGNOSIS — J301 Allergic rhinitis due to pollen: Secondary | ICD-10-CM | POA: Insufficient documentation

## 2017-03-20 HISTORY — DX: Allergic rhinitis due to pollen: J30.1

## 2017-06-15 ENCOUNTER — Other Ambulatory Visit: Payer: Self-pay | Admitting: Cardiology

## 2018-01-12 DIAGNOSIS — E890 Postprocedural hypothyroidism: Secondary | ICD-10-CM

## 2018-01-12 HISTORY — DX: Postprocedural hypothyroidism: E89.0

## 2020-06-12 DIAGNOSIS — B0089 Other herpesviral infection: Secondary | ICD-10-CM

## 2020-06-12 HISTORY — DX: Other herpesviral infection: B00.89

## 2021-05-07 DIAGNOSIS — M150 Primary generalized (osteo)arthritis: Secondary | ICD-10-CM

## 2021-05-07 HISTORY — DX: Primary generalized (osteo)arthritis: M15.0

## 2021-06-24 DIAGNOSIS — J4520 Mild intermittent asthma, uncomplicated: Secondary | ICD-10-CM | POA: Insufficient documentation

## 2021-06-24 DIAGNOSIS — Z923 Personal history of irradiation: Secondary | ICD-10-CM

## 2021-06-24 HISTORY — DX: Mild intermittent asthma, uncomplicated: J45.20

## 2021-06-24 HISTORY — DX: Personal history of irradiation: Z92.3

## 2022-03-26 LAB — HM COLONOSCOPY

## 2023-02-03 DIAGNOSIS — L501 Idiopathic urticaria: Secondary | ICD-10-CM

## 2023-02-03 HISTORY — DX: Idiopathic urticaria: L50.1

## 2023-07-29 ENCOUNTER — Other Ambulatory Visit (INDEPENDENT_AMBULATORY_CARE_PROVIDER_SITE_OTHER): Payer: BC Managed Care – PPO

## 2023-07-29 ENCOUNTER — Ambulatory Visit: Payer: BC Managed Care – PPO | Admitting: Gastroenterology

## 2023-07-29 ENCOUNTER — Encounter: Payer: Self-pay | Admitting: Gastroenterology

## 2023-07-29 VITALS — HR 80 | Ht 69.0 in | Wt 199.0 lb

## 2023-07-29 DIAGNOSIS — R1031 Right lower quadrant pain: Secondary | ICD-10-CM

## 2023-07-29 DIAGNOSIS — R1032 Left lower quadrant pain: Secondary | ICD-10-CM

## 2023-07-29 DIAGNOSIS — R933 Abnormal findings on diagnostic imaging of other parts of digestive tract: Secondary | ICD-10-CM

## 2023-07-29 DIAGNOSIS — R195 Other fecal abnormalities: Secondary | ICD-10-CM | POA: Diagnosis not present

## 2023-07-29 DIAGNOSIS — R197 Diarrhea, unspecified: Secondary | ICD-10-CM | POA: Diagnosis not present

## 2023-07-29 HISTORY — DX: Other fecal abnormalities: R19.5

## 2023-07-29 HISTORY — DX: Abnormal findings on diagnostic imaging of other parts of digestive tract: R93.3

## 2023-07-29 HISTORY — DX: Left lower quadrant pain: R10.32

## 2023-07-29 HISTORY — DX: Right lower quadrant pain: R10.31

## 2023-07-29 LAB — COMPREHENSIVE METABOLIC PANEL
ALT: 23 U/L (ref 0–53)
AST: 23 U/L (ref 0–37)
Albumin: 4.9 g/dL (ref 3.5–5.2)
Alkaline Phosphatase: 56 U/L (ref 39–117)
BUN: 10 mg/dL (ref 6–23)
CO2: 28 meq/L (ref 19–32)
Calcium: 9.6 mg/dL (ref 8.4–10.5)
Chloride: 101 meq/L (ref 96–112)
Creatinine, Ser: 0.94 mg/dL (ref 0.40–1.50)
GFR: 92.75 mL/min (ref >60.00)
Glucose, Bld: 115 mg/dL — ABNORMAL HIGH (ref 70–99)
Potassium: 4.2 meq/L (ref 3.5–5.1)
Sodium: 137 meq/L (ref 135–145)
Total Bilirubin: 0.6 mg/dL (ref 0.2–1.2)
Total Protein: 8 g/dL (ref 6.0–8.3)

## 2023-07-29 NOTE — Progress Notes (Addendum)
 07/29/2023 Chr-Ming Roger [id number] 1970-09-18   HISTORY OF PRESENT ILLNESS: This is a 53 year old Asian male who is new to our office.  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  Here today with complaints of lower abdominal pain.  He reports that the left lower quadrant abdominal pain has been present since about September 2023 after his last colonoscopy.  The right sided abdominal pain has been present a little bit longer than that.  His colonoscopy was performed through [facility] in September 2023 and that showed an erosion at the terminal ileum which was biopsied.  Also showed a single polyp from the sigmoid colon that was removed.  He had mild diverticulosis of the ascending and transverse colon and medium size internal hemorrhoids.  Do not have pathology results, however.  Says that the pain in the right lower quadrant is worse, persistent and strong.  Is present daily.  Does have dicyclomine which he takes twice a day and that helps with the left lower quadrant abdominal pain, but not the right lower quadrant abdominal pain.  Complains of being gassy.  He says that his bowel movements tend to be on the looser side of things.  No rectal bleeding.  Past Medical History:  Diagnosis Date   Arthritis    BPH (benign prostatic hyperplasia)    GERD (gastroesophageal reflux disease)    H/O cold sores    Lower back pain    PVC (premature ventricular contraction)    W/ Holter monitor demonstrated trigeminy and bigeminy PVCs   Thyroid  disease    Past Surgical History:  Procedure Laterality Date   EXERCISE STRESS TEST  03/29/2011   Negative adequate Bruce protocol exercise stress test   NM MYOVIEW  LTD  04/2013   LOW RISK. No ischemia or infarction. Normal stress test.   ROTATOR CUFF REPAIR Right    TRANSTHORACIC ECHOCARDIOGRAM  10/01/2006   EF 55-65%, relatively normal    reports that he has never smoked. He has never used smokeless tobacco. He reports that he does not drink  alcohol and does not use drugs. family history includes Hyperlipidemia in his mother; Hypertension in his father; Prostate cancer in his maternal grandfather; Stroke in his father, paternal grandfather, and paternal grandmother; Uterine cancer in his maternal grandmother. Allergies  Allergen Reactions   Ciprofloxacin Rash   Doxycycline Rash      Outpatient Encounter Medications as of 07/29/2023  Medication Sig   albuterol (VENTOLIN HFA) 108 (90 Base) MCG/ACT inhaler Inhale 1 puff into the lungs every 4 (four) hours as needed.   dicyclomine (BENTYL) 20 MG tablet Take 20 mg by mouth 2 (two) times daily.   finasteride (PROSCAR) 5 MG tablet Take 1 tablet by mouth every other day.   omalizumab (XOLAIR) 300 MG/2ML injection Inject 300 mg into the skin every 30 (thirty) days.   pantoprazole (PROTONIX) 40 MG tablet Take 40 mg by mouth daily as needed.   SYNTHROID 100 MCG tablet Take 1 tablet by mouth daily.   valACYclovir (VALTREX) 1000 MG tablet Take 1,000 mg by mouth 3 (three) times daily as needed.   [DISCONTINUED] EPINEPHrine 0.3 mg/0.3 mL IJ SOAJ injection Inject 0.3 mg into the muscle as needed.   [DISCONTINUED] metoprolol  succinate (TOPROL -XL) 25 MG 24 hr tablet TAKE 1 TABLET (25 MG TOTAL) BY MOUTH DAILY.   No facility-administered encounter medications on file as of 07/29/2023.    REVIEW OF SYSTEMS  : All other systems reviewed and negative except where noted in  the History of Present Illness.   PHYSICAL EXAM: Pulse 80   Ht 5' 9 (1.753 m)   Wt 199 lb (90.3 kg)   BMI 29.39 kg/m  General: Well developed male in no acute distress Head: Normocephalic and atraumatic Eyes:  Sclerae anicteric, conjunctiva pink. Ears: Normal auditory acuity Lungs: Clear throughout to auscultation; no W/R/R. Heart: Regular rate and rhythm; no M/R/G. Abdomen: Soft, non-distended.  BS present.  Mild tenderness in the lower abdomen. Musculoskeletal: Symmetrical with no gross deformities  Skin: No lesions  on visible extremities Neurological: Alert oriented x 4, grossly non-focal Psychological:  Alert and cooperative. Normal mood and affect  ASSESSMENT AND PLAN: *53 year old male with complaints of lower abdominal pain, left-sided since September 2023, but right sided pain even prior to that.  Also reports issues with loose stool.  Colonoscopy September 2023 did show some erosion at the terminal ileum and biopsies were obtained, but I do not have those biopsy results.  We will have him sign for records to see if we can get pathology on that.  Will check a CT enterography and a fecal calprotectin.  With his persistent pain and complaints of looser stools want to try to rule out chronic IBD picture.  Did have 2 regular CT scans in September 2023 in December 2023 that were unremarkable.  **Addendum: Received pathology from his colonoscopy in September 2023.  Sigmoid colon polyp was hyperplastic.  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  He also had an EGD in August 2023 that we were able to get records of and he was found to have normal esophagus, mild gastritis in the entire examined stomach, single polyp in the gastric fundus.  Duodenal mucosa showed no abnormalities.  Duodenal biopsies were normal.  Gastric biopsy showed some mild chronic inflammation with reactive foveolar changes, negative for H. pylori.  Polyp was a fundic gland polyp.  Records sent for scanning.  CC:  Burnard Jayson RAMAN, MD

## 2023-07-29 NOTE — Patient Instructions (Signed)
 Your provider has requested that you go to the basement level for lab work before leaving today. Press B on the elevator. The lab is located at the first door on the left as you exit the elevator.  You have been scheduled for a CT scan of the abdomen and pelvis at [facility], 1st floor Radiology. You are scheduled on Thursday 08/07/23 at 3:30 pm. You should arrive 15 minutes prior to your appointment time for registration.    Please follow the written instructions below on the day of your exam:   1) Do not eat anything after 12:30 pm (4 hours prior to your test)   You may take any medications as prescribed with a small amount of water, if necessary. If you take any of the following medications: METFORMIN, GLUCOPHAGE, GLUCOVANCE, AVANDAMET, RIOMET, FORTAMET, ACTOPLUS MET, JANUMET, GLUMETZA or METAGLIP, you MAY be asked to HOLD this medication 48 hours AFTER the exam.   The purpose of you drinking the oral contrast is to aid in the visualization of your intestinal tract. The contrast solution may cause some diarrhea. Depending on your individual set of symptoms, you may also receive an intravenous injection of x-ray contrast/dye. Plan on being at [facility] for 45 minutes or longer, depending on the type of exam you are having performed.   If you have any questions regarding your exam or if you need to reschedule, you may call [facility] at [phone] between the hours of 8:00 am and 5:00 pm, Monday-Friday.   _______________________________________________________  If your blood pressure at your visit was 140/90 or greater, please contact your primary care physician to follow up on this.  _______________________________________________________  If you are age 54 or older, your body mass index should be between 23-30. Your Body mass index is 29.39 kg/m. If this is out of the aforementioned range listed, please consider follow up with your Primary Care Provider.  If you  are age 46 or younger, your body mass index should be between 19-25. Your Body mass index is 29.39 kg/m. If this is out of the aformentioned range listed, please consider follow up with your Primary Care Provider.   ________________________________________________________  The [facility] providers would like to encourage you to use MYCHART to communicate with providers for non-urgent requests or questions.  Due to long hold times on the telephone, sending your provider a message by [facility] may be a faster and more efficient way to get a response.  Please allow 48 business hours for a response.  Please remember that this is for non-urgent requests.  _______________________________________________________

## 2023-07-30 ENCOUNTER — Other Ambulatory Visit: Payer: BC Managed Care – PPO

## 2023-07-30 DIAGNOSIS — R1032 Left lower quadrant pain: Secondary | ICD-10-CM

## 2023-07-30 DIAGNOSIS — R195 Other fecal abnormalities: Secondary | ICD-10-CM

## 2023-07-30 DIAGNOSIS — R1031 Right lower quadrant pain: Secondary | ICD-10-CM

## 2023-08-01 LAB — CALPROTECTIN, FECAL: Calprotectin, Fecal: 21 ug/g (ref 0–120)

## 2023-08-07 ENCOUNTER — Ambulatory Visit (HOSPITAL_COMMUNITY): Payer: BC Managed Care – PPO

## 2023-08-15 ENCOUNTER — Ambulatory Visit (HOSPITAL_COMMUNITY)
Admission: RE | Admit: 2023-08-15 | Discharge: 2023-08-15 | Disposition: A | Discharge status: Home / Self Care | Payer: BC Managed Care – PPO | Source: Ambulatory Visit | Attending: Gastroenterology | Admitting: Gastroenterology

## 2023-08-15 DIAGNOSIS — R195 Other fecal abnormalities: Secondary | ICD-10-CM

## 2023-08-15 DIAGNOSIS — R1032 Left lower quadrant pain: Secondary | ICD-10-CM

## 2023-08-15 DIAGNOSIS — R1031 Right lower quadrant pain: Secondary | ICD-10-CM | POA: Diagnosis present

## 2023-08-15 DIAGNOSIS — R933 Abnormal findings on diagnostic imaging of other parts of digestive tract: Secondary | ICD-10-CM

## 2023-08-15 MED ORDER — IOHEXOL 300 MG/ML  SOLN
100.0000 mL | Freq: Once | INTRAMUSCULAR | Status: AC | PRN
  Administered 2023-08-15: 100 mL via INTRAVENOUS

## 2023-08-15 MED ORDER — BARIUM SULFATE 0.1 % PO SUSP
ORAL | Status: AC
  Filled 2023-08-15: qty 3

## 2023-08-15 MED ORDER — BARIUM SULFATE 0.1 % PO SUSP: 450.0000 mL | Freq: Once | ORAL | Status: DC

## 2023-08-15 NOTE — Progress Notes (Signed)
Agree with assessment/plan. CTE today Pl arrange for FU  Edman Circle, MD [facility] [phone]

## 2023-08-26 NOTE — Progress Notes (Signed)
Faxed records release to [facility] again on 08/25/23

## 2023-10-30 ENCOUNTER — Encounter: Payer: Self-pay | Admitting: Cardiology

## 2023-10-30 DIAGNOSIS — M1990 Unspecified osteoarthritis, unspecified site: Secondary | ICD-10-CM | POA: Insufficient documentation

## 2023-10-30 DIAGNOSIS — M4698 Unspecified inflammatory spondylopathy, sacral and sacrococcygeal region: Secondary | ICD-10-CM | POA: Insufficient documentation

## 2023-10-30 DIAGNOSIS — E7800 Pure hypercholesterolemia, unspecified: Secondary | ICD-10-CM

## 2023-10-30 HISTORY — DX: Unspecified inflammatory spondylopathy, sacral and sacrococcygeal region: M46.98

## 2023-10-30 HISTORY — DX: Pure hypercholesterolemia, unspecified: E78.00

## 2023-10-30 HISTORY — DX: Unspecified osteoarthritis, unspecified site: M19.90

## 2023-11-04 ENCOUNTER — Ambulatory Visit: Admitting: Cardiology

## 2023-11-11 ENCOUNTER — Other Ambulatory Visit: Payer: Self-pay

## 2023-11-11 ENCOUNTER — Ambulatory Visit: Attending: Cardiology | Admitting: Cardiology

## 2023-11-11 VITALS — BP 122/72 | HR 79 | Ht 69.0 in | Wt 200.8 lb

## 2023-11-11 DIAGNOSIS — I493 Ventricular premature depolarization: Secondary | ICD-10-CM | POA: Insufficient documentation

## 2023-11-11 DIAGNOSIS — R079 Chest pain, unspecified: Secondary | ICD-10-CM | POA: Diagnosis not present

## 2023-11-11 DIAGNOSIS — M545 Low back pain, unspecified: Secondary | ICD-10-CM | POA: Insufficient documentation

## 2023-11-11 DIAGNOSIS — Z8619 Personal history of other infectious and parasitic diseases: Secondary | ICD-10-CM | POA: Insufficient documentation

## 2023-11-11 DIAGNOSIS — E7800 Pure hypercholesterolemia, unspecified: Secondary | ICD-10-CM | POA: Diagnosis not present

## 2023-11-11 DIAGNOSIS — E079 Disorder of thyroid, unspecified: Secondary | ICD-10-CM | POA: Insufficient documentation

## 2023-11-11 DIAGNOSIS — K219 Gastro-esophageal reflux disease without esophagitis: Secondary | ICD-10-CM | POA: Insufficient documentation

## 2023-11-11 DIAGNOSIS — M1990 Unspecified osteoarthritis, unspecified site: Secondary | ICD-10-CM | POA: Insufficient documentation

## 2023-11-11 NOTE — Progress Notes (Signed)
 Cardiology Office Note:    Date:  11/11/2023   ID:  Steven, Howell [birth year], MRN [id number]  PCP:  Mitchell Ana, PA-C  Cardiologist:  Nelia Balzarine, MD   Referring MD: Mitchell Ana, PA-C    ASSESSMENT:    1. Pure hypercholesterolemia   2. Chest pain of uncertain etiology    PLAN:    In order of problems listed above:  Primary prevention stressed with the patient.  Importance of compliance with diet medication stressed and patient verbalized standing. Chest pain: Atypical in nature.  Patient doing fine.  I reassured him about this.  He wants to have his exercise program, I will do a exercise stress echo to reassure them.  This will also give me some idea of the cardiac anatomy. Risk stratification with calcium scoring CT scan: Patient is agreeable and we will set this up for him. Obesity: Weight reduction stressed diet emphasized and he promises to do better. Patient will be seen in follow-up appointment in 6 months or earlier if the patient has any concerns.    Medication Adjustments/Labs and Tests Ordered: Current medicines are reviewed at length with the patient today.  Concerns regarding medicines are outlined above.  Orders Placed This Encounter  Procedures   EKG 12-Lead   No orders of the defined types were placed in this encounter.    History of Present Illness:    Steven Howell is a 53 y.o. male who is being seen today for the evaluation of chest pain at the request of Mitchell Ana, PA-C.  Patient is a pleasant 53 year old male.  He has past medical history of PVCs.  He denies any problems at this time.  His only issue is having chest pain.  He mentions to me that he is has chest pain when he gets up in the morning.  No orthopnea or PND.  His chest pain does not wake him up.  It occurs at rest.  Does not occur with exercise.  He can walk about 2030 minutes without any issues.  No radiation to the neck or to the arm.  At the time of my evaluation,  the patient is alert awake oriented and in no distress.   Past Medical History:  Diagnosis Date   Abnormal colonoscopy 07/29/2023   Arthritis    BPH (benign prostatic hyperplasia)    Chronic idiopathic urticaria 02/03/2023   Chronic pain of left knee 06/13/2016   Chronic right-sided back pain 08/21/2015   Functional dyspepsia 04/24/2016   Generalized anxiety disorder 11/03/2015   GERD (gastroesophageal reflux disease)    H/O cold sores    History of radioactive iodine thyroid  ablation 06/24/2021   Hypothyroidism, unspecified 11/25/2006   Taking Synthroid 100 mg to correct the problem     Inflammatory arthritis 10/30/2023   LLQ abdominal pain 07/29/2023   Loose stools 07/29/2023   Lower back pain    Male erectile disorder 08/21/2015   Mild intermittent asthma without complication 06/24/2021   Neck pain 09/14/2014   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   Primary osteoarthritis involving multiple joints 05/07/2021   Pure hypercholesterolemia 10/30/2023   PVC (premature ventricular contraction)    W/ Holter monitor demonstrated trigeminy and bigeminy PVCs   PVC's (premature ventricular contractions) 04/23/2013   Recurrent oral herpes simplex infection 06/12/2020   RLQ abdominal pain 07/29/2023   Seasonal allergic rhinitis due to pollen 03/20/2017   Sprain of medial collateral ligament of left knee 06/13/2016   Thyroid  disease  Unspecified inflammatory spondylopathy, sacral and sacrococcygeal region [facility]) 10/30/2023    Past Surgical History:  Procedure Laterality Date   EXERCISE STRESS TEST  03/29/2011   Negative adequate Bruce protocol exercise stress test   NM MYOVIEW  LTD  04/2013   LOW RISK. No ischemia or infarction. Normal stress test.   ROTATOR CUFF REPAIR Right    TRANSTHORACIC ECHOCARDIOGRAM  10/01/2006   EF 55-65%, relatively normal    Current Medications: Current Meds  Medication Sig   dicyclomine (BENTYL) 20 MG tablet Take 20 mg by mouth 2 (two) times  daily.   finasteride (PROSCAR) 5 MG tablet Take 1 tablet by mouth every other day.   omalizumab (XOLAIR) 300 MG/2ML injection Inject 300 mg into the skin every 30 (thirty) days.   pantoprazole (PROTONIX) 40 MG tablet Take 40 mg by mouth daily as needed (indigestion).   SYNTHROID 100 MCG tablet Take 1 tablet by mouth daily.   valACYclovir (VALTREX) 1000 MG tablet Take 1,000 mg by mouth 3 (three) times daily as needed (out breaks).     Allergies:   Ciprofloxacin and Doxycycline   Social History   Socioeconomic History   Marital status: Married    Spouse name: Not on file   Number of children: 2   Years of education: Not on file   Highest education level: Not on file  Occupational History   Not on file  Tobacco Use   Smoking status: Never   Smokeless tobacco: Never  Vaping Use   Vaping status: Never Used  Substance and Sexual Activity   Alcohol use: No   Drug use: No   Sexual activity: Not on file  Other Topics Concern   Not on file  Social History Narrative   He is a married father of 2. He works for [employer].   He does not smoke or drink alcohol.   Social Drivers of Corporate investment banker Strain: Not on file  Food Insecurity: Low Risk  (03/28/2023)   Received from [facility]   Hunger Vital Sign    Worried About Running Out of Food in the Last Year: Never true    Ran Out of Food in the Last Year: Never true  Transportation Needs: No Transportation Needs (03/28/2023)   Received from [facility]    In the past 12 months, has lack of reliable transportation kept you from medical appointments, meetings, work or from getting things needed for daily living? : No  Physical Activity: Not on file  Stress: Not on file  Social Connections: Not on file     Family History: The patient's family history includes Hyperlipidemia in his mother; Hypertension in his father; Prostate cancer in his maternal grandfather; Stroke in his father, paternal grandfather,  and paternal grandmother; Uterine cancer in his maternal grandmother. There is no history of Colon cancer or Esophageal cancer.  ROS:   Please see the history of present illness.    All other systems reviewed and are negative.  EKGs/Labs/Other Studies Reviewed:    The following studies were reviewed today:  EKG Interpretation Date/Time:  Tuesday November 11 2023 14:40:02 EDT Ventricular Rate:  79 PR Interval:  136 QRS Duration:  82 QT Interval:  368 QTC Calculation: 421 R Axis:   8  Text Interpretation: Normal sinus rhythm T wave abnormality, consider inferior ischemia No previous ECGs available Confirmed by Hillis Lu [phone] on 11/11/2023 2:46:38 PM     Recent Labs: 07/29/2023: ALT 23; BUN 10; Creatinine, Ser  0.94; Potassium 4.2; Sodium 137  Recent Lipid Panel No results found for: "CHOL", "TRIG", "HDL", "CHOLHDL", "VLDL", "LDLCALC", "LDLDIRECT"  Physical Exam:    VS:  BP 122/72   Pulse 79   Ht 5\' 9"  (1.753 m)   Wt 200 lb 12.8 oz (91.1 kg)   SpO2 93%   BMI 29.65 kg/m     Wt Readings from Last 3 Encounters:  11/11/23 200 lb 12.8 oz (91.1 kg)  07/29/23 199 lb (90.3 kg)  05/30/16 170 lb (77.1 kg)     GEN: Patient is in no acute distress HEENT: Normal NECK: No JVD; No carotid bruits LYMPHATICS: No lymphadenopathy CARDIAC: S1 S2 regular, 2/6 systolic murmur at the apex. RESPIRATORY:  Clear to auscultation without rales, wheezing or rhonchi  ABDOMEN: Soft, non-tender, non-distended MUSCULOSKELETAL:  No edema; No deformity  SKIN: Warm and dry NEUROLOGIC:  Alert and oriented x 3 PSYCHIATRIC:  Normal affect    Signed, Nelia Balzarine, MD  11/11/2023 3:04 PM    [facility]

## 2023-11-11 NOTE — Addendum Note (Signed)
 Addended by: Aurelio Leer I on: 11/11/2023 03:16 PM   Modules accepted: Orders

## 2023-11-11 NOTE — Patient Instructions (Signed)
 Medication Instructions:  Your physician recommends that you continue on your current medications as directed. Please refer to the Current Medication list given to you today.  *If you need a refill on your cardiac medications before your next appointment, please call your pharmacy*  Lab Work: None If you have labs (blood work) drawn today and your tests are completely normal, you will receive your results only by: MyChart Message (if you have MyChart) OR A paper copy in the mail If you have any lab test that is abnormal or we need to change your treatment, we will call you to review the results.  Testing/Procedures: We will order CT coronary calcium score. It will cost $99.00 and is due at time of scan.  Please call to schedule.    [facility] at [facility] [address] [place], [place] [address] [phone]  [facility] [address] [place], [place] [address] [phone]   Your physician has requested that you have a stress echocardiogram. For further information please visit [email or web address] Please follow instruction sheet as given.  Please note: We ask at that you not bring children with you during ultrasound (echo/ vascular) testing. Due to room size and safety concerns, children are not allowed in the ultrasound rooms during exams. Our front office staff cannot provide observation of children in our lobby area while testing is being conducted. An adult accompanying a patient to their appointment will only be allowed in the ultrasound room at the discretion of the ultrasound technician under special circumstances. We apologize for any inconvenience.    Follow-Up: At [facility], you and your health needs are our priority.  As part of our continuing mission to provide you with exceptional heart care, our providers are all part of one team.  This team includes your primary Cardiologist (physician) and Advanced  Practice Providers or APPs (Physician Assistants and Nurse Practitioners) who all work together to provide you with the care you need, when you need it.  Your next appointment:   9 month(s)  Provider:   Hillis Lu, MD    We recommend signing up for the patient portal called "MyChart".  Sign up information is provided on this After Visit Summary.  MyChart is used to connect with patients for Virtual Visits (Telemedicine).  Patients are able to view lab/test results, encounter notes, upcoming appointments, etc.  Non-urgent messages can be sent to your provider as well.   To learn more about what you can do with MyChart, go to [email or web address].   Other Instructions None

## 2023-11-12 ENCOUNTER — Ambulatory Visit (HOSPITAL_BASED_OUTPATIENT_CLINIC_OR_DEPARTMENT_OTHER)
Admission: RE | Admit: 2023-11-12 | Discharge: 2023-11-12 | Disposition: A | Discharge status: Home / Self Care | Payer: Self-pay | Source: Ambulatory Visit | Attending: Cardiology | Admitting: Cardiology

## 2023-11-12 DIAGNOSIS — E7800 Pure hypercholesterolemia, unspecified: Secondary | ICD-10-CM | POA: Insufficient documentation

## 2023-11-12 DIAGNOSIS — R079 Chest pain, unspecified: Secondary | ICD-10-CM | POA: Insufficient documentation

## 2023-11-17 ENCOUNTER — Telehealth: Payer: Self-pay

## 2023-11-17 DIAGNOSIS — E7800 Pure hypercholesterolemia, unspecified: Secondary | ICD-10-CM

## 2023-11-17 NOTE — Telephone Encounter (Signed)
 MyChart message

## 2023-11-17 NOTE — Telephone Encounter (Signed)
-----   Message from Micael Adas Revankar sent at 11/16/2023  5:27 PM EDT ----- Get patient in for Chem-7 liver lipid check.  He will need statin therapy.  Calcium score is elevated.  Wait for extracardiac finding report and call us  in 2 weeks if he does not hear from us .  Copy primary Nelia Balzarine, MD 11/16/2023 5:27 PM

## 2023-11-27 ENCOUNTER — Encounter (HOSPITAL_COMMUNITY): Payer: Self-pay

## 2023-11-28 ENCOUNTER — Telehealth (HOSPITAL_COMMUNITY): Payer: Self-pay | Admitting: *Deleted

## 2023-11-28 NOTE — Telephone Encounter (Signed)
 Pt given instructions for stress echo.

## 2023-12-02 ENCOUNTER — Ambulatory Visit

## 2023-12-02 ENCOUNTER — Telehealth: Payer: Self-pay

## 2023-12-02 DIAGNOSIS — R079 Chest pain, unspecified: Secondary | ICD-10-CM

## 2023-12-03 NOTE — Telephone Encounter (Signed)
 Attestation

## 2023-12-04 ENCOUNTER — Ambulatory Visit (HOSPITAL_COMMUNITY)
Admission: RE | Admit: 2023-12-04 | Discharge: 2023-12-04 | Disposition: A | Discharge status: Home / Self Care | Source: Ambulatory Visit | Attending: Cardiovascular Disease | Admitting: Cardiovascular Disease

## 2023-12-04 DIAGNOSIS — R079 Chest pain, unspecified: Secondary | ICD-10-CM | POA: Diagnosis present

## 2023-12-04 DIAGNOSIS — E7800 Pure hypercholesterolemia, unspecified: Secondary | ICD-10-CM | POA: Insufficient documentation

## 2023-12-04 LAB — ECHOCARDIOGRAM STRESS TEST
Area-P 1/2: 3.95 cm2
S' Lateral: 2.8 cm

## 2023-12-04 MED ORDER — PERFLUTREN LIPID MICROSPHERE
1.0000 mL | INTRAVENOUS | Status: DC | PRN
  Administered 2023-12-04: 3 mL via INTRAVENOUS

## 2023-12-12 ENCOUNTER — Ambulatory Visit: Payer: Self-pay | Admitting: Cardiology

## 2023-12-19 ENCOUNTER — Ambulatory Visit: Admitting: Cardiology
